# Patient Record
Sex: Female | Born: 1985 | Race: Black or African American | Hispanic: No | Marital: Single | Smoking: Current some day smoker
Health system: Southern US, Community
[De-identification: ages and names within clinical notes are randomized; demographics above are authoritative.]

## PROBLEM LIST (undated history)

## (undated) ENCOUNTER — Ambulatory Visit (HOSPITAL_COMMUNITY): Payer: Self-pay

## (undated) DIAGNOSIS — J45909 Unspecified asthma, uncomplicated: Secondary | ICD-10-CM

## (undated) DIAGNOSIS — R569 Unspecified convulsions: Secondary | ICD-10-CM

## (undated) HISTORY — PX: TUBAL LIGATION: SHX77

## (undated) HISTORY — DX: Unspecified convulsions: R56.9

## (undated) HISTORY — PX: CHOLECYSTECTOMY: SHX55

---
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
## 2007-03-19 ENCOUNTER — Inpatient Hospital Stay (HOSPITAL_COMMUNITY): Admission: AD | Admit: 2007-03-19 | Discharge: 2007-03-19 | Payer: Self-pay | Admitting: Obstetrics & Gynecology

## 2007-03-26 ENCOUNTER — Ambulatory Visit: Payer: Self-pay | Admitting: *Deleted

## 2007-03-26 ENCOUNTER — Inpatient Hospital Stay (HOSPITAL_COMMUNITY): Admission: AD | Admit: 2007-03-26 | Discharge: 2007-03-26 | Payer: Self-pay | Admitting: Obstetrics & Gynecology

## 2007-03-27 ENCOUNTER — Inpatient Hospital Stay (HOSPITAL_COMMUNITY): Admission: AD | Admit: 2007-03-27 | Discharge: 2007-03-27 | Payer: Self-pay | Admitting: Obstetrics

## 2007-03-27 ENCOUNTER — Ambulatory Visit: Payer: Self-pay | Admitting: Physician Assistant

## 2007-04-02 ENCOUNTER — Inpatient Hospital Stay (HOSPITAL_COMMUNITY): Admission: AD | Admit: 2007-04-02 | Discharge: 2007-04-03 | Payer: Self-pay | Admitting: Obstetrics & Gynecology

## 2007-04-02 ENCOUNTER — Ambulatory Visit: Payer: Self-pay | Admitting: Obstetrics & Gynecology

## 2007-04-06 ENCOUNTER — Ambulatory Visit: Payer: Self-pay | Admitting: Obstetrics & Gynecology

## 2007-04-06 ENCOUNTER — Inpatient Hospital Stay (HOSPITAL_COMMUNITY): Admission: AD | Admit: 2007-04-06 | Discharge: 2007-04-07 | Payer: Self-pay | Admitting: Obstetrics & Gynecology

## 2007-04-10 ENCOUNTER — Ambulatory Visit: Payer: Self-pay | Admitting: Obstetrics and Gynecology

## 2007-04-10 ENCOUNTER — Inpatient Hospital Stay (HOSPITAL_COMMUNITY): Admission: AD | Admit: 2007-04-10 | Discharge: 2007-04-10 | Payer: Self-pay | Admitting: Gynecology

## 2007-04-24 ENCOUNTER — Inpatient Hospital Stay (HOSPITAL_COMMUNITY): Admission: AD | Admit: 2007-04-24 | Discharge: 2007-04-24 | Payer: Self-pay | Admitting: Obstetrics and Gynecology

## 2007-04-24 ENCOUNTER — Ambulatory Visit: Payer: Self-pay | Admitting: Obstetrics and Gynecology

## 2007-04-30 ENCOUNTER — Ambulatory Visit (HOSPITAL_COMMUNITY): Admission: RE | Admit: 2007-04-30 | Discharge: 2007-04-30 | Payer: Self-pay | Admitting: Physician Assistant

## 2007-05-09 ENCOUNTER — Ambulatory Visit: Payer: Self-pay | Admitting: *Deleted

## 2007-05-11 ENCOUNTER — Ambulatory Visit: Payer: Self-pay | Admitting: *Deleted

## 2007-05-11 ENCOUNTER — Inpatient Hospital Stay (HOSPITAL_COMMUNITY): Admission: AD | Admit: 2007-05-11 | Discharge: 2007-05-13 | Payer: Self-pay | Admitting: Obstetrics & Gynecology

## 2007-05-15 ENCOUNTER — Inpatient Hospital Stay (HOSPITAL_COMMUNITY): Admission: AD | Admit: 2007-05-15 | Discharge: 2007-05-16 | Payer: Self-pay | Admitting: Obstetrics & Gynecology

## 2007-05-15 ENCOUNTER — Ambulatory Visit: Payer: Self-pay | Admitting: Obstetrics & Gynecology

## 2007-05-16 ENCOUNTER — Ambulatory Visit: Payer: Self-pay | Admitting: *Deleted

## 2007-05-20 ENCOUNTER — Ambulatory Visit: Payer: Self-pay | Admitting: *Deleted

## 2007-05-20 ENCOUNTER — Inpatient Hospital Stay (HOSPITAL_COMMUNITY): Admission: AD | Admit: 2007-05-20 | Discharge: 2007-05-20 | Payer: Self-pay

## 2007-05-21 ENCOUNTER — Ambulatory Visit (HOSPITAL_COMMUNITY): Admission: RE | Admit: 2007-05-21 | Discharge: 2007-05-21 | Payer: Self-pay | Admitting: Physician Assistant

## 2007-05-27 ENCOUNTER — Ambulatory Visit: Payer: Self-pay | Admitting: Obstetrics & Gynecology

## 2007-06-08 ENCOUNTER — Inpatient Hospital Stay (HOSPITAL_COMMUNITY): Admission: AD | Admit: 2007-06-08 | Discharge: 2007-06-08 | Payer: Self-pay | Admitting: Obstetrics & Gynecology

## 2007-06-10 ENCOUNTER — Ambulatory Visit: Payer: Self-pay | Admitting: Family Medicine

## 2007-06-10 ENCOUNTER — Ambulatory Visit (HOSPITAL_COMMUNITY): Admission: RE | Admit: 2007-06-10 | Discharge: 2007-06-10 | Payer: Self-pay | Admitting: Physician Assistant

## 2007-06-17 ENCOUNTER — Ambulatory Visit: Payer: Self-pay | Admitting: Family Medicine

## 2007-06-25 ENCOUNTER — Ambulatory Visit: Payer: Self-pay | Admitting: Gynecology

## 2007-06-25 ENCOUNTER — Inpatient Hospital Stay (HOSPITAL_COMMUNITY): Admission: AD | Admit: 2007-06-25 | Discharge: 2007-06-29 | Payer: Self-pay | Admitting: Obstetrics & Gynecology

## 2007-07-17 ENCOUNTER — Ambulatory Visit: Payer: Self-pay | Admitting: Obstetrics & Gynecology

## 2007-08-14 ENCOUNTER — Inpatient Hospital Stay (HOSPITAL_COMMUNITY): Admission: AD | Admit: 2007-08-14 | Discharge: 2007-08-14 | Payer: Self-pay | Admitting: Gynecology

## 2007-09-20 ENCOUNTER — Emergency Department (HOSPITAL_COMMUNITY): Admission: EM | Admit: 2007-09-20 | Discharge: 2007-09-20 | Payer: Self-pay | Admitting: Emergency Medicine

## 2007-09-27 ENCOUNTER — Emergency Department (HOSPITAL_COMMUNITY): Admission: EM | Admit: 2007-09-27 | Discharge: 2007-09-27 | Payer: Self-pay | Admitting: Emergency Medicine

## 2009-10-18 ENCOUNTER — Inpatient Hospital Stay (HOSPITAL_COMMUNITY): Admission: AD | Admit: 2009-10-18 | Discharge: 2009-10-18 | Payer: Self-pay | Admitting: Obstetrics & Gynecology

## 2010-02-14 ENCOUNTER — Inpatient Hospital Stay (HOSPITAL_COMMUNITY): Admission: AD | Admit: 2010-02-14 | Discharge: 2010-02-14 | Payer: Self-pay | Admitting: Obstetrics and Gynecology

## 2010-03-30 ENCOUNTER — Inpatient Hospital Stay (HOSPITAL_COMMUNITY): Admission: AD | Admit: 2010-03-30 | Discharge: 2010-03-30 | Payer: Self-pay | Admitting: Obstetrics and Gynecology

## 2010-04-02 ENCOUNTER — Inpatient Hospital Stay (HOSPITAL_COMMUNITY): Admission: AD | Admit: 2010-04-02 | Discharge: 2010-04-02 | Payer: Self-pay | Admitting: Obstetrics & Gynecology

## 2010-04-28 ENCOUNTER — Ambulatory Visit: Payer: Self-pay | Admitting: Family Medicine

## 2010-04-28 LAB — CONVERTED CEMR LAB
Basophils Relative: 0 % (ref 0–1)
Eosinophils Absolute: 0.1 10*3/uL (ref 0.0–0.7)
Glucose, Bld: 69 mg/dL — ABNORMAL LOW (ref 70–99)
HIV: NONREACTIVE
Hemoglobin: 9.8 g/dL — ABNORMAL LOW (ref 12.0–15.0)
Hgb A: 97.3 % (ref 96.8–97.8)
Hgb F Quant: 0 % (ref 0.0–2.0)
Lymphocytes Relative: 31 % (ref 12–46)
MCHC: 31.2 g/dL (ref 30.0–36.0)
Monocytes Relative: 8 % (ref 3–12)
Neutro Abs: 4.5 10*3/uL (ref 1.7–7.7)
RBC: 4.13 M/uL (ref 3.87–5.11)
RDW: 16.3 % — ABNORMAL HIGH (ref 11.5–15.5)
Rh Type: POSITIVE

## 2010-05-05 ENCOUNTER — Encounter: Payer: Self-pay | Admitting: Physician Assistant

## 2010-05-05 ENCOUNTER — Ambulatory Visit: Payer: Self-pay | Admitting: Obstetrics & Gynecology

## 2010-05-12 ENCOUNTER — Ambulatory Visit: Payer: Self-pay | Admitting: Obstetrics & Gynecology

## 2010-05-19 ENCOUNTER — Encounter: Payer: Self-pay | Admitting: Obstetrics and Gynecology

## 2010-05-19 ENCOUNTER — Ambulatory Visit: Payer: Self-pay | Admitting: Family Medicine

## 2010-05-26 ENCOUNTER — Ambulatory Visit: Payer: Self-pay | Admitting: Family Medicine

## 2010-05-26 ENCOUNTER — Inpatient Hospital Stay (HOSPITAL_COMMUNITY)
Admission: AD | Admit: 2010-05-26 | Discharge: 2010-05-29 | Payer: Self-pay | Source: Home / Self Care | Attending: Family Medicine | Admitting: Family Medicine

## 2010-05-27 ENCOUNTER — Ambulatory Visit (HOSPITAL_COMMUNITY): Admission: RE | Admit: 2010-05-27 | Payer: Self-pay | Source: Home / Self Care | Admitting: Obstetrics & Gynecology

## 2010-06-08 ENCOUNTER — Inpatient Hospital Stay (HOSPITAL_COMMUNITY)
Admission: AD | Admit: 2010-06-08 | Discharge: 2010-06-08 | Payer: Self-pay | Source: Home / Self Care | Attending: Obstetrics and Gynecology | Admitting: Obstetrics and Gynecology

## 2010-06-19 ENCOUNTER — Encounter: Payer: Self-pay | Admitting: Obstetrics

## 2010-07-07 ENCOUNTER — Ambulatory Visit: Payer: Self-pay | Admitting: Family Medicine

## 2010-08-08 LAB — POCT URINALYSIS DIPSTICK
Bilirubin Urine: NEGATIVE
Glucose, UA: NEGATIVE mg/dL
Glucose, UA: NEGATIVE mg/dL
Hgb urine dipstick: NEGATIVE
Ketones, ur: 40 mg/dL — AB
Nitrite: NEGATIVE
Nitrite: NEGATIVE
Nitrite: NEGATIVE
Protein, ur: NEGATIVE mg/dL
Protein, ur: NEGATIVE mg/dL
Protein, ur: NEGATIVE mg/dL
Specific Gravity, Urine: 1.02 (ref 1.005–1.030)
Specific Gravity, Urine: 1.015 (ref 1.005–1.030)
Specific Gravity, Urine: 1.015 (ref 1.005–1.030)
Specific Gravity, Urine: 1.02 (ref 1.005–1.030)
Urobilinogen, UA: 1 mg/dL (ref 0.0–1.0)
Urobilinogen, UA: 2 mg/dL — ABNORMAL HIGH (ref 0.0–1.0)
Urobilinogen, UA: 2 mg/dL — ABNORMAL HIGH (ref 0.0–1.0)
pH: 6 (ref 5.0–8.0)
pH: 6.5 (ref 5.0–8.0)

## 2010-08-08 LAB — CBC
HCT: 30.1 % — ABNORMAL LOW (ref 36.0–46.0)
Hemoglobin: 8.3 g/dL — ABNORMAL LOW (ref 12.0–15.0)
Hemoglobin: 9.2 g/dL — ABNORMAL LOW (ref 12.0–15.0)
MCHC: 30.9 g/dL (ref 30.0–36.0)
Platelets: 320 10*3/uL (ref 150–400)
RBC: 3.97 MIL/uL (ref 3.87–5.11)
RDW: 17 % — ABNORMAL HIGH (ref 11.5–15.5)
WBC: 8.8 10*3/uL (ref 4.0–10.5)

## 2010-08-09 LAB — URINE CULTURE
Colony Count: 30000
Culture  Setup Time: 201111030142

## 2010-08-09 LAB — URINALYSIS, ROUTINE W REFLEX MICROSCOPIC
Bilirubin Urine: NEGATIVE
Hgb urine dipstick: NEGATIVE
Nitrite: NEGATIVE
Urobilinogen, UA: 4 mg/dL — ABNORMAL HIGH (ref 0.0–1.0)
pH: 7 (ref 5.0–8.0)

## 2010-08-09 LAB — URINE MICROSCOPIC-ADD ON

## 2010-08-09 LAB — STREP B DNA PROBE: Strep Group B Ag: NEGATIVE

## 2010-08-09 LAB — GC/CHLAMYDIA PROBE AMP, GENITAL: Chlamydia, DNA Probe: NEGATIVE

## 2010-08-11 LAB — URINE CULTURE
Colony Count: NO GROWTH
Culture  Setup Time: 201109200226

## 2010-08-11 LAB — URINALYSIS, ROUTINE W REFLEX MICROSCOPIC
Nitrite: NEGATIVE
Specific Gravity, Urine: 1.01 (ref 1.005–1.030)
pH: 6.5 (ref 5.0–8.0)

## 2010-08-11 LAB — WET PREP, GENITAL

## 2010-08-11 LAB — URINE MICROSCOPIC-ADD ON

## 2010-08-11 LAB — GC/CHLAMYDIA PROBE AMP, GENITAL: GC Probe Amp, Genital: NEGATIVE

## 2010-08-15 LAB — URINE CULTURE: Colony Count: >=100000

## 2010-08-15 LAB — URINALYSIS, ROUTINE W REFLEX MICROSCOPIC
Bilirubin Urine: NEGATIVE
Ketones, ur: NEGATIVE mg/dL
Nitrite: POSITIVE — AB
pH: 7.5 (ref 5.0–8.0)

## 2010-08-15 LAB — WET PREP, GENITAL: Yeast Wet Prep HPF POC: NONE SEEN

## 2010-08-15 LAB — URINE MICROSCOPIC-ADD ON

## 2010-08-15 LAB — GC/CHLAMYDIA PROBE AMP, GENITAL: Chlamydia, DNA Probe: NEGATIVE
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
## 2010-10-11 NOTE — Discharge Summary (Signed)
NAMECLOMA, RAHRIG            ACCOUNT NO.:  [id number]   MEDICAL RECORD NO.:  [id number]          PATIENT TYPE:  INP   LOCATION:                                [facility]  [facility]   PHYSICIAN:  Tanya S. Shawnie Pons, M.D.   DATE OF BIRTH:  [birth year]   DATE OF ADMISSION:  DATE OF DISCHARGE:                               DISCHARGE SUMMARY   REASON FOR ADMISSION:  Onset of labor.   PROCEDURES:  Prenatal include ultrasound and NST.   HOSPITAL COURSE:  This is a 25 year old G2, P2-0-2-2, admitted at 40  weeks with spontaneous onset of labor.  She had late prenatal care  starting at 20 weeks.  Went to clinic at [facility].  She was  delivered via cesarean section a low transverse cervical due to  persistent nonreassuring fetal heart tones and failure to progress.  During the operation she was noted to have nuchal cord x1 which was  quite loose.  Her 3 vessel cord placenta was removed manually.  She  delivered a viable female, Apgar 1 at one minute and 9 at five minutes.  Weight 6 pounds 15 ounces, and length 19-1/2-inches.  EBL for the  procedure was 700 mL.  There were no complications postpartum.  For  complete summary of operative report, please see dictated note.   DISCHARGE DIAGNOSIS:  Term pregnancy, delivered.   INSTRUCTIONS:  She is to do no heavy lifting and to use pelvic rest for  6 weeks.  Her diet is routine.   MEDICATIONS:  Include:  1. Percocet 5/225, 1-2 tablets p.o. q.4-6 h. p.r.n. pain.  2. Ibuprofen 600 mg 1 tab p.o. q.6 h. p.r.n. pain.  3. Prenatal vitamins 1 tab p.o. daily.  4. Colace 100 mg 1 tab p.o. b.i.d. p.r.n. constipation.  5. Iron sulfate 325 mg 1 tab p.o. b.i.d.   Her status is well and she is to follow up in 6 weeks at [facility].   PERTINENT LABORATORY VALUES:  Include blood type A positive, antibody  negative, rubella immune.  Postoperative hemoglobin 6.7 on June 29, 2007, which was stable.   She is breast feeding and  bottle feeding in combination.  She is getting  a Depo shot for birth control, and she plans to get her circumcision  done at [facility].      Ancil Boozer, MD      Shelbie Proctor. Shawnie Pons, M.D.  Electronically Signed    SA/MEDQ  D:  06/29/2007  T:  06/29/2007  Job:  [id number]

## 2010-10-11 NOTE — Op Note (Signed)
[name], [name] NO.:  [id number]   MEDICAL RECORD NO.:  [id number]           PATIENT TYPE:   LOCATION:                                 FACILITY:   PHYSICIAN:  Ginger Carne, MD  DATE OF BIRTH:  [birth year]   DATE OF PROCEDURE:  DATE OF DISCHARGE:                               OPERATIVE REPORT   PREOPERATIVE DIAGNOSIS:  Failure to progress first stage of labor,  nonreassuring fetal heart rate.   POSTOPERATIVE DIAGNOSIS:  Failure to progress first stage of labor,  nonreassuring fetal heart rate, term viable delivery of female infant.   PROCEDURE:  Primary low transverse cesarean section.   SURGEON:  Ginger Carne, MD.   ASSISTANT:  None.   COMPLICATIONS:  None immediate.   ESTIMATED BLOOD LOSS:  700 mL.   SPECIMEN:  Cord bloods.   ANESTHESIA:  Epidural.   OPERATIVE FINDINGS:  Term viable female infant delivered in a vertex  presentation.  Apgars and weight per delivery room record.  No gross  abnormalities.  Baby cried spontaneously at delivery.  Amniotic fluid  was clear, not foul smelling.  Nuchal cord times one.  Placenta was  complete, three-vessel cord with central insertion.   OPERATIVE PROCEDURE:  The patient prepped and draped in usual fashion  and placed in left lateral supine position.  Betadine solution used for  antiseptic and the patient was catheterized prior to the procedure.  After adequate epidural analgesia, a Pfannenstiel incision was made and  the abdomen opened.  Lower uterine segment incised transversely after  developing the bladder flap.  Baby delivered, cord clamped and cut and  infant given to the pediatric staff after bulb suctioning.  Placenta  removed manually.  Uterus inspected.  Closure of the uterine musculature  in one layer with 0 Vicryl running interlocking suture.  Bleeding points  hemostatically checked.  Blood clots removed.  Closure of the fascia in  one layer with 0 Vicryl running suture, and skin  staples for the skin.  Instrument and sponge counts were correct.  The patient tolerated the  procedure well, returned post anesthesia recovery room in excellent  condition.     Ginger Carne, MD  Electronically Signed    SHB/MEDQ  D:  06/26/2007  T:  06/26/2007  Job:  [id number]

## 2010-10-14 NOTE — Discharge Summary (Signed)
Caitlin [name], Caitlin [name]               ACCOUNT NO.:  [id number]   MEDICAL RECORD NO.:  [id number]          PATIENT TYPE:  INP   LOCATION:  9159                          [facility]ILITY[facility]  [facility]   PHYSICIAN:  Roseanna Rainbow, M.D.DATE OF BIRTH:  03-06-1986   DATE OF ADMISSION:  02/07/2005  DATE OF DISCHARGE:  02/10/2005                                 DISCHARGE SUMMARY   CHIEF COMPLAINT:  The patient is a 25 year old gravida 1, para 0, who  presents at 36-6/7 weeks complaining of abdominal pain.   HISTORY OF PRESENT ILLNESS:  She reported worsening epigastric pain.  Prenatal course: source of care [facility]; Complications or  risks: adolescent, cholelithiasis.   ALLERGIES:  No known drug allergies.   MEDICATIONS:  Albuterol, Zyrtec, Singulair, Zofran, hydrocodone, Pepcid,  prenatal vitamins, and Benadryl.   PAST OB GYN HISTORY:  Noncontributory.   PAST MEDICAL HISTORY:  Please see the above.   PAST SURGICAL HISTORY:  She denies.   FAMILY AND SOCIAL HISTORY:  She is single, high [employer].   PRENATAL LAB RESULTS:  A positive, antibody screen negative, rubella immune,  hepatitis B surface antigen negative, syphilis nonreactive, HIV nonreactive,  one hour GTT.   PHYSICAL EXAMINATION:  VITAL SIGNS:  Stable, afebrile.  GENERAL:  Appears uncomfortable, mild distress.  ABDOMEN:  Gravid, right upper quadrant mildly tender.  EXTREMITIES:  No edema.  NEUROLOGIC:  Nonfocal.  Fetal heart rate baseline 130s-140s average.  Long term variability,  reactive, no deceleration, contractions none.   ASSESSMENT:  Intrauterine pregnancy at 36-6/7 weeks with acute gallstone  attack.  Fetal heart tracing consistent with fetal well being.   PLAN:  Admission, supportive measures.   HOSPITAL COURSE:  The patient was admitted and given analgesics as needed.  Her pain resolved.  She was felt not to have acute cholecystitis and she was  discharged to home on hospital day #3.   DISCHARGE DIAGNOSES:  1.  Intrauterine pregnancy at 36+ weeks.  2.  Cholelithiasis.   CONDITION:  Stable.   DIET:  Low fat.   MEDICATIONS:  Resume home medications.   DISPOSITION:  The patient was to followup in the office in 1 week.   ACTIVITY:  Ad lib      Roseanna Rainbow, M.D.  Electronically Signed     LAJ/MEDQ  D:  03/26/2005  T:  03/27/2005  Job:  [id number]

## 2010-10-14 NOTE — Discharge Summary (Signed)
[name], Caitlin [name]               ACCOUNT NO.:  [id number]   MEDICAL RECORD NO.:  [id number]          PATIENT TYPE:  INP   LOCATION:  9319                          [facility]   PHYSICIAN:  Roseanna Rainbow, M.D.DATE OF BIRTH:  12-08-1985   DATE OF ADMISSION:  10/06/2004  DATE OF DISCHARGE:  10/08/2004                                 DISCHARGE SUMMARY   CHIEF COMPLAINT:  The patient is an 25 year old gravida 1, para 0 with an  intrauterine pregnancy at 19+ weeks complaining of nausea and vomiting.  Please see the dictated history and physical for further details.   HOSPITAL COURSE:  The patient was admitted.  She was given anti-emetics and  hydration.  Her symptoms improved and she was discharged to home on hospital  day #2.   DISCHARGE DIAGNOSES:  1.  Hyperemesis gravidarum.  2.  Intrauterine pregnancy at 19+ weeks.   CONDITION ON DISCHARGE:  Stable.   DIET:  Regular.   ACTIVITY:  Ad lib.   MEDICATIONS:  Phenergan rectal suppositories as needed.   DISPOSITION:  The patient had a previous appointment in the office.       LAJ/MEDQ  D:  11/04/2004  T:  11/04/2004  Job:  [id number]

## 2010-10-14 NOTE — H&P (Signed)
Caitlin [name], Caitlin [name]               ACCOUNT NO.:  [id number]   MEDICAL RECORD NO.:  [id number]          PATIENT TYPE:  INP   LOCATION:  9319                          [facility]   PHYSICIAN:  Roseanna Rainbow, M.D.DATE OF BIRTH:  July 18, 1985   DATE OF ADMISSION:  10/06/2004  DATE OF DISCHARGE:                                HISTORY & PHYSICAL   CHIEF COMPLAINT:  The patient is an 25 year old, gravida 1, para 0 with an  intrauterine pregnancy at 19 plus weeks complaining of nausea and vomiting.   HISTORY OF PRESENT ILLNESS:  The patient has a history of pregnancy related  nausea and vomiting earlier during the pregnancy. However, over the last  four weeks, the vomiting had resolved and she has had intermittent nausea.  Over the past two days, she has had persistent nausea, vomiting and left  lower quadrant pain. She denies any fever, diarrhea, or exposures.  Antepartum course problems with adolescent see the above.   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 She has no  significant smoking history. She does not give any significant history of  alcohol usage. She denies illicit drug use.   MEDICATIONS:  Prenatal vitamins. Albuterol inhaler.   PAST MEDICAL HISTORY:  Asthma.   ALLERGIES:  No known drug allergies.   PHYSICAL EXAMINATION:  VITAL SIGNS:  Stable, afebrile.  GENERAL:  Slightly toxic appearing.  ABDOMEN:  Gravid, nontender, fundal height at the umbilicus.  PELVIC:  Sterile vaginal exam, the cervix is long and closed.   Urinalysis remarkable for large ketones, trace protein, no hematuria. Fetal  heart rate by Doppler 160.   ASSESSMENT:  Intrauterine pregnancy at 19 plus weeks with hyperemesis  gravidarum, dehydration also with lower abdominal pain, questionable  etiology.   PLAN:  Admission IV hydration, antiemetics.      LAJ/MEDQ  D:  10/06/2004  T:  10/06/2004  Job:  [id number]

## 2010-10-14 NOTE — Op Note (Signed)
[name], Caitlin               ACCOUNT NO.:  [id number]   MEDICAL RECORD NO.:  [id number]          PATIENT TYPE:  OIB   LOCATION:  0098                         FACILITY:  [facility]   PHYSICIAN:  Anselm Pancoast. Weatherly, M.D.DATE OF BIRTH:  06/22/1985   DATE OF PROCEDURE:  04/27/2005  DATE OF DISCHARGE:                                 OPERATIVE REPORT   PREOPERATIVE DIAGNOSIS:  Chronic cholecystitis, recent postpartum.   POSTOPERATIVE DIAGNOSIS:  Chronic cholecystitis, recent postpartum.   OPERATIONS:  Laparoscopic cholecystectomy with cholangiogram.   HISTORY:  Caitlin [name] is a 25 year old female who I saw in the office  about a 2 weeks ago. She looks younger than her stated age and she is about  8 weeks postpartum. She is being home schooled and has had a few episodes of  right upper quadrant abdominal pain. They noted gallstones on an ultrasound  during her pregnancy and she actually had not been to the emergency room at  any time. Dr. Clearance Coots is her regular physician and referred her to [place] with  nausea and vomiting and on physical exam she was not acutely tender. She is  very small and appears younger than her stated age of 70. I recommended that  we proceed on with a laparoscopic cholecystectomy and cholangiogram and she  is here today for the planned procedure.   Her liver function studies were normal preoperatively and her white count  was 3800. The patient was given 3 grams of Unasyn, she has PAS stockings and  taken to the operative suite. The abdomen was prepped with Betadine surgical  solution and draped in a sterile manner. The patient's got a lot of stretch  marks but no evidence of any masses or tenderness on exam. A small vertical  incision made below the umbilicus, the fascia identified, a small opening  carefully made through the fascia and then I picked up this with Kocher's.  The peritoneum was kind of opened carefully with a Tresa Endo and then a  pursestring suture of  #0 Vicryl was placed and the Hasson cannula  introduced. The gallbladder was not acutely inflamed, there were adhesions  up around it. The adhesions were carefully taken down, good hemostasis and  the peritoneum stripped away from the proximal portion of the gallbladder  and then the cystic duct and the cystic artery were both separated so that  you could see the area nicely. I then clipped the cystic duct junction with  the gallbladder and then doubly clipped the cystic artery proximally,  singly, distally and divided the artery. I then made a little opening within  the proximal cystic duct and a Cook catheter was introduced, held in place  with __________ and a good x-ray obtained which shows the intra and  extrahepatic biliary system visualizing good flow of dye into the duodenum.  The catheter was removed, the cystic duct stump was triply clipped and then  divided and then the gallbladder was freed from its bed using the hook  electrocautery and good hemostasis obtained. I then grasped the gallbladder  switching the camera to the upper 10  mm port and withdrew the gallbladder.  You could feel the small stones within it and it was sent intact to the  pathologist. Reinspection, good hemostasis, I tied the pursestring suture at  the umbilicus and put an additional figure-of-eight. We made a small  substernal area for the 10 mm trocar that had been anesthetized and two  lateral 5 mm trocars had been anesthetized. The umbilicus with fascia was  anesthetized and a figure-of-eight suture placed. I did place an anterior  fascia suture in the epigastric area and then the subcutaneous wounds were  closed with 4-0  Vicryl, Benzoin and Steri-Strips on skin. The patient tolerated the  procedure nicely and she hopes to be released this afternoon. She can resume  the care for her child and I think is going to return to regular school at  [employer] some time in the next few weeks or at least after  Christmas.           ______________________________  Anselm Pancoast. Zachery Dakins, M.D.     WJW/MEDQ  D:  04/27/2005  T:  04/28/2005  Job:  [id number]   cc:   Leonette Most A. Clearance Coots, M.D.  Fax: [phone]

## 2010-10-14 NOTE — H&P (Signed)
Caitlin [name], Caitlin [name]               ACCOUNT NO.:  [id number]   MEDICAL RECORD NO.:  [id number]          PATIENT TYPE:  INP   LOCATION:  9168                          [facility]   PHYSICIAN:  Roseanna Rainbow, M.D.DATE OF BIRTH:  06-Jun-1985   DATE OF ADMISSION:  01/17/2005  DATE OF DISCHARGE:                                HISTORY & PHYSICAL   CHIEF COMPLAINT:  The patient is a 25 year old gravida 1 para 0 with an  estimated date of confinement of March 01, 2005 with an intrauterine  pregnancy at 33+ weeks complaining of abdominal pain and uterine  contractions.   HISTORY OF PRESENT ILLNESS:  The patient had similar complaints and was  recently hospitalized in early August. Workup at that point included an  abdominal ultrasound on January 03, 2005 that demonstrated gallstones and  gallbladder sludge. General surgery was consulted and the decision was made  to proceed with expectant management until after the patient delivers, at  which point she would undergo a cholecystectomy.   ANTEPARTUM PROBLEMS/RISKS:  Please see the above. She has history of asthma.  She is an adolescent.   PRENATAL SCREENS:  Antibody screen negative. Blood type A positive.  Chlamydia negative. GC negative. Hepatitis B surface antigen negative.  Hemoglobin 10.9. HIV nonreactive. Pap smear within normal limits. Platelets  392,000. Sickle cell screen negative. Urine culture and sensitivity  negative. RPR nonreactive. An ultrasound on April 10 at 14 weeks 5 days  demonstrated a limited anatomic survey.   SOCIAL HISTORY:  She is a Advice worker at [employer]. She does not have any  significant smoking history. She does not give any significant history of  alcohol usage. She denies illicit drug use.   PAST OBSTETRICAL AND GYNECOLOGICAL HISTORY:  Noncontributory.   PAST MEDICAL HISTORY:  Please see the above.   PAST SURGICAL HISTORY:  She denies.   ALLERGIES:  No known drug allergies.   PHYSICAL  EXAMINATION:  VITAL SIGNS:  Stable, afebrile. Fetal heart rate with  the Doppler 130s.  ABDOMEN:  There is right epigastric tenderness. However, there is no rebound  or guarding.  PELVIC:  Sterile vaginal exam:  The cervix is closed and posterior. There is  lower uterine segment development.   ASSESSMENT:  Intrauterine pregnancy at 33+ weeks, rule out biliary colic.   PLAN:  Admission, laboratory work, supportive care, reconsult with general  surgery as needed.      Roseanna Rainbow, M.D.  Electronically Signed     LAJ/MEDQ  D:  01/18/2005  T:  01/18/2005  Job:  [id number]

## 2010-10-14 NOTE — Discharge Summary (Signed)
NAMEJARITA, RAVAL               ACCOUNT NO.:  [id number]   MEDICAL RECORD NO.:  [id number]          PATIENT TYPE:  INP   LOCATION:  9155                          FACILITY:  [facility]   PHYSICIAN:  Charles A. Clearance Coots, M.D.DATE OF BIRTH:  21-Mar-1986   DATE OF ADMISSION:  01/01/2005  DATE OF DISCHARGE:  01/05/2005                                 DISCHARGE SUMMARY   ADMITTING DIAGNOSES:  1.  [redacted] weeks gestation.  2.  Severe abdominal pain.  3.  Rule out cholecystitis.   DISCHARGE DIAGNOSES:  1.  [redacted] weeks gestation.  2.  Severe abdominal pain.  3.  Cholecystitis and gallstones on ultrasound and clinically, much improved      after intravenous hydration and dietary management and supportive care.      Discharged home at approximately [redacted] weeks gestation undelivered,      improved.   REASON FOR ADMISSION:  A 25 year old black female.  Estimated date of  confinement of March 01, 2005.  Presented to triage at [facility]  with severe upper abdominal pain which had started several hours earlier to  her presentation.  The patient also complained of a pinkish discharge.  She  denied nausea, vomiting, diarrhea, or dysuria.   PAST MEDICAL HISTORY:   SURGERY:  None.   ILLNESSES:  Seasonal allergies.   MEDICATIONS:  Albuterol inhaler, Singulair, Zyrtec.   ALLERGIES:  No known drug allergies.   SOCIAL HISTORY:  Adolescent.  Negative tobacco, alcohol, or recreational  drug use.   PHYSICAL EXAMINATION:  GENERAL:  Small, slim adolescent in moderate  distress.  VITAL SIGNS:  Temperature 98.6, pulse 76, respiratory rate 22, blood  pressure 111/57.  HEENT:  Normal.  LUNGS:  Clear to auscultation bilaterally.  HEART:  Regular rate and rhythm.  ABDOMEN:  Gravid.  Severe right upper quadrant tenderness to palpation.  Positive Murphy's.  Uterus is nontender.  PELVIC:  The cervix is long and closed.   LABORATORY VALUES:  Hemoglobin 9.4, hematocrit 28.5, white blood cell count  6100,  platelets 349,000.  Comprehensive metabolic panel was within normal  limits.  Fetal fibronectin was negative.  Wet prep revealed too numerous to  count white blood cells.  Urinalysis within normal limits.   HOSPITAL COURSE:  The patient was admitted and started on IV fluid  hydration, IV antibiotic therapy, and analgesic therapy.  She responded well  to therapy and gallbladder ultrasound was performed the following morning  after admission which revealed gallstones and gallbladder sludge which was  consistent with acute cholecystitis and gallstones.  General surgery  consultation was obtained by Dr. Consuello Bossier and evaluation was done  and recommendation was made for an attempt at conservative management during  pregnancy and possible operative surgical therapy after delivery of the baby  if possible.  Patient continued to improve clinically and able to tolerate a  diet and was therefore discharged home on hospital day #4 at approximately  [redacted] weeks gestation undelivered and much improved.   DISCHARGE LABORATORY VALUES:  Hemoglobin 8.5, hematocrit 25.5, white blood  cell count 5100, platelets 302,000.  Comprehensive metabolic panel  was  within normal limits except for a potassium of 3.2.   DISCHARGE DISPOSITION:  Patient is to continue with a low fat type diet at  home and will follow up in the office in one week.  Tylenol elixir was  prescribed for pain and she was to continue with Niferex elixir for her  anemia.  Patient apparently cannot take pills very well.      Charles A. Clearance Coots, M.D.  Electronically Signed     CAH/MEDQ  D:  01/05/2005  T:  01/05/2005  Job:  [id number]   cc:   Anselm Pancoast. Zachery Dakins, M.D.  [address], [address]  [place]  [place] [address]

## 2010-10-14 NOTE — Consult Note (Signed)
[name], Caitlin               ACCOUNT NO.:  [id number]   MEDICAL RECORD NO.:  [id number]          PATIENT TYPE:  INP   LOCATION:  9155                          [facility]ACILITY:  [facility]   PHYSICIAN:  Anselm Pancoast. Weatherly, M.D.DATE OF BIRTH:  1986/04/24   DATE OF CONSULTATION:  01/02/2005  DATE OF DISCHARGE:                                   CONSULTATION   CHIEF COMPLAINT:  Gallstones, intrauterine pregnancy.   HISTORY:  Caitlin [name] is a 25 year old female who was admitted by Dr.  Clearance Coots with symptomatic gallstones on Saturday. The patient is due March 01, 2005 and she states that she is still having some epigastric pain.  However, fortunately her white count has been normal and her liver function  studies are normal with the exception of a slightly elevated alkaline  phosphatase of 133. An ultrasound this morning did show sludge within her  gallbladder and possible tenderness with pressure with the probe, cannot  exclude acute cholecystitis, and there is mild right hydronephrosis because  of her pregnancy.   On physical exam, at this time the patient is sleeping. She had been given  sedation and medication shortly earlier. Her father is with her and on  examination of her abdomen she is certainly not acutely tender but it is  possible that she is mildly tender on palpation of the right upper quadrant.  She is presently on a low fat diet and solid food, but she has not eaten any  lunch, and hopefully her symptoms can be controlled with mild pain  medication and then a low fat diet and not have to proceed with a  cholecystectomy until after she delivers. If she does continue to have more  frequent episodes of pain it is possible that she will not be able to wait  the nearly 2 months and other alternatives can be performed. At this time I  would tend to try to treat her symptomatically and not make any plans for  surgery but hopefully be able to complete the pregnancy. We will follow  along since she is obviously not ready for discharge now.       WJW/MEDQ  D:  01/02/2005  T:  01/02/2005  Job:  [id number]

## 2010-10-14 NOTE — H&P (Signed)
NAMEVERLINDA, [name]               ACCOUNT NO.:  [id number]   MEDICAL RECORD NO.:  [id number]          PATIENT TYPE:  INP   LOCATION:  9175                          [facility]ACILITY:  [facility]   PHYSICIAN:  Roseanna Rainbow, M.D.DATE OF BIRTH:  [birth year]   DATE OF ADMISSION:  02/20/2005  DATE OF DISCHARGE:                                HISTORY & PHYSICAL   CHIEF COMPLAINT:  The patient is a 25 year old gravida 1, para 0 with an  estimated date of confinement of March 01, 2005 with an intrauterine  pregnancy at 38+ weeks who presents for induction of labor.   HISTORY OF PRESENT ILLNESS:  Please see the above.  Antepartum course,  problems, risks:  Cholelithiasis, cholestasis, asthma, adolescent.   LABORATORY DATA:  Prenatal screens with antibody screen negative, blood type  A positive. Gonorrhea and Chlamydia negative.  GBS negative on January 28, 2005.  Hepatitis B surface antigen negative.  Hemoglobin 10.9, HIV negative.  Pap smear within normal limits.  Platelet count 392,000. Rubella immune.  Sickle cell negative.  Urine culture and sensitivity negative.  RPR  nonreactive.  Ultrasound on September 05, 2004 estimated gestational age [redacted]  weeks, 5 days which was one week different from her estimated gestational  age by dates.   SOCIAL HISTORY:  She is a Advice worker at [employer].  She has no significant  smoking history.  Does not give any significant history of alcohol usage.  Denies illicit drug use.   PAST MEDICAL HISTORY:  Please see the above.   PAST SURGICAL HISTORY:  She denies past surgery.   PAST OBSTETRICAL/GYNECOLOGICAL HISTORY:  Noncontributory.   ALLERGIES:  No known drug allergies.   MEDICATIONS:  1.  Singulair.  2.  Zyrtec.  3.  Actigall.  4.  Prenatal vitamins.  5.  Phenergan.  6.  Tylenol.   PHYSICAL EXAMINATION:  VITAL SIGNS:  Stable, afebrile.  Fetal heart tracing  reassuring.  Tocodynamometer with no uterine contractions.  PELVIC:  Sterile vaginal examination  in the office shows cervix mid, closed,  50% effaced.   ASSESSMENT:  Primigravida with intrauterine pregnancy at term complicated by  cholelithiasis, cholestasis, unfavorable Bishop's score.   PLAN:  Admission, two stage induction of labor.      Roseanna Rainbow, M.D.  Electronically Signed     LAJ/MEDQ  D:  02/20/2005  T:  02/20/2005  Job:  [id number]

## 2011-02-15 LAB — POCT URINALYSIS DIP (DEVICE)
Operator id: 297281
Protein, ur: 30 — AB
Urobilinogen, UA: 1

## 2011-02-16 LAB — POCT URINALYSIS DIP (DEVICE)
Operator id: 297281
Protein, ur: NEGATIVE
Urobilinogen, UA: 0.2

## 2011-02-16 LAB — CBC
HCT: 20.7 — ABNORMAL LOW
Hemoglobin: 6.1 — CL
Hemoglobin: 6.6 — CL
MCHC: 32.4
MCHC: 33.4
MCV: 71 — ABNORMAL LOW
MCV: 72.3 — ABNORMAL LOW
Platelets: 399
RBC: 2.56 — ABNORMAL LOW
RBC: 2.77 — ABNORMAL LOW
RBC: 2.86 — ABNORMAL LOW
RDW: 19.7 — ABNORMAL HIGH
WBC: 8.8

## 2011-02-16 LAB — RPR: RPR Ser Ql: NONREACTIVE

## 2011-03-03 LAB — URINALYSIS, ROUTINE W REFLEX MICROSCOPIC
Bilirubin Urine: NEGATIVE
Bilirubin Urine: NEGATIVE
Glucose, UA: NEGATIVE
Glucose, UA: NEGATIVE
Hgb urine dipstick: NEGATIVE
Ketones, ur: NEGATIVE
Protein, ur: NEGATIVE
Specific Gravity, Urine: 1.02
Urobilinogen, UA: 1
pH: 6.5

## 2011-03-03 LAB — POCT URINALYSIS DIP (DEVICE)
Glucose, UA: 100 — AB
Hgb urine dipstick: NEGATIVE
Operator id: 14902
Specific Gravity, Urine: 1.02

## 2011-03-06 LAB — HEPATITIS B SURFACE ANTIGEN: Hepatitis B Surface Ag: NEGATIVE

## 2011-03-06 LAB — CBC
MCHC: 32.8
MCV: 76.3 — ABNORMAL LOW
Platelets: 271
RBC: 3.56 — ABNORMAL LOW
WBC: 9.8

## 2011-03-06 LAB — STREP B DNA PROBE: Strep Group B Ag: NEGATIVE

## 2011-03-06 LAB — MAGNESIUM: Magnesium: 5.6 — ABNORMAL HIGH

## 2011-03-06 LAB — POCT URINALYSIS DIP (DEVICE)
Bilirubin Urine: NEGATIVE
Glucose, UA: NEGATIVE
Hgb urine dipstick: NEGATIVE
Operator id: 134861
Specific Gravity, Urine: 1.025

## 2011-03-06 LAB — DIFFERENTIAL
Band Neutrophils: 0
Basophils Relative: 0
Eosinophils Relative: 0
Lymphocytes Relative: 19
Promyelocytes Absolute: 0
Smear Review: ADEQUATE

## 2011-03-06 LAB — RPR: RPR Ser Ql: NONREACTIVE

## 2011-03-07 LAB — URINALYSIS, ROUTINE W REFLEX MICROSCOPIC
Bilirubin Urine: NEGATIVE
Bilirubin Urine: NEGATIVE
Glucose, UA: NEGATIVE
Glucose, UA: NEGATIVE
Glucose, UA: NEGATIVE
Hgb urine dipstick: NEGATIVE
Ketones, ur: 15 — AB
Ketones, ur: NEGATIVE
Ketones, ur: NEGATIVE
Nitrite: NEGATIVE
Nitrite: NEGATIVE
Nitrite: NEGATIVE
Protein, ur: NEGATIVE
Protein, ur: NEGATIVE
Specific Gravity, Urine: 1.025
Specific Gravity, Urine: 1.01
Urobilinogen, UA: 0.2
pH: 6.5
pH: 6

## 2011-03-07 LAB — URINE MICROSCOPIC-ADD ON

## 2011-03-07 LAB — STREP B DNA PROBE

## 2011-03-07 LAB — WET PREP, GENITAL
Clue Cells Wet Prep HPF POC: NONE SEEN
Trich, Wet Prep: NONE SEEN

## 2011-03-07 LAB — FETAL FIBRONECTIN: Fetal Fibronectin: NEGATIVE

## 2011-03-08 LAB — URINALYSIS, ROUTINE W REFLEX MICROSCOPIC
Bilirubin Urine: NEGATIVE
Hgb urine dipstick: NEGATIVE
Nitrite: NEGATIVE
Nitrite: POSITIVE — AB
Protein, ur: NEGATIVE
Specific Gravity, Urine: >1.03 — ABNORMAL HIGH
Urobilinogen, UA: 1
Urobilinogen, UA: 4 — ABNORMAL HIGH
pH: 6

## 2011-03-08 LAB — GC/CHLAMYDIA PROBE AMP, GENITAL
Chlamydia, DNA Probe: POSITIVE — AB
GC Probe Amp, Genital: POSITIVE — AB

## 2011-03-08 LAB — URINE MICROSCOPIC-ADD ON

## 2011-03-08 LAB — FETAL FIBRONECTIN: Fetal Fibronectin: NEGATIVE

## 2011-03-08 LAB — WET PREP, GENITAL: Yeast Wet Prep HPF POC: NONE SEEN

## 2012-05-31 ENCOUNTER — Emergency Department (HOSPITAL_COMMUNITY)
Admission: EM | Admit: 2012-05-31 | Discharge: 2012-05-31 | Disposition: A | Discharge status: Home / Self Care | Payer: Self-pay | Attending: Emergency Medicine | Admitting: Emergency Medicine

## 2012-05-31 ENCOUNTER — Encounter (HOSPITAL_COMMUNITY): Payer: Self-pay | Admitting: Emergency Medicine

## 2012-05-31 DIAGNOSIS — F17200 Nicotine dependence, unspecified, uncomplicated: Secondary | ICD-10-CM | POA: Insufficient documentation

## 2012-05-31 DIAGNOSIS — J111 Influenza due to unidentified influenza virus with other respiratory manifestations: Secondary | ICD-10-CM | POA: Insufficient documentation

## 2012-05-31 DIAGNOSIS — R5381 Other malaise: Secondary | ICD-10-CM | POA: Insufficient documentation

## 2012-05-31 DIAGNOSIS — R062 Wheezing: Secondary | ICD-10-CM | POA: Insufficient documentation

## 2012-05-31 DIAGNOSIS — J029 Acute pharyngitis, unspecified: Secondary | ICD-10-CM | POA: Insufficient documentation

## 2012-05-31 DIAGNOSIS — J3489 Other specified disorders of nose and nasal sinuses: Secondary | ICD-10-CM | POA: Insufficient documentation

## 2012-05-31 DIAGNOSIS — R509 Fever, unspecified: Secondary | ICD-10-CM | POA: Insufficient documentation

## 2012-05-31 DIAGNOSIS — R630 Anorexia: Secondary | ICD-10-CM | POA: Insufficient documentation

## 2012-05-31 DIAGNOSIS — Z79899 Other long term (current) drug therapy: Secondary | ICD-10-CM | POA: Insufficient documentation

## 2012-05-31 DIAGNOSIS — R51 Headache: Secondary | ICD-10-CM | POA: Insufficient documentation

## 2012-05-31 DIAGNOSIS — R112 Nausea with vomiting, unspecified: Secondary | ICD-10-CM | POA: Insufficient documentation

## 2012-05-31 DIAGNOSIS — R000 Tachycardia, unspecified: Secondary | ICD-10-CM | POA: Insufficient documentation

## 2012-05-31 MED ORDER — PROMETHAZINE HCL 25 MG PO TABS: 25.0000 mg | ORAL_TABLET | Freq: Four times a day (QID) | ORAL | Status: DC | PRN

## 2012-05-31 MED ORDER — ACETAMINOPHEN 325 MG PO TABS
650.0000 mg | ORAL_TABLET | Freq: Once | ORAL | Status: AC
  Administered 2012-05-31: 650 mg via ORAL
  Filled 2012-05-31: qty 2

## 2012-05-31 MED ORDER — OSELTAMIVIR PHOSPHATE 75 MG PO CAPS: 75.0000 mg | ORAL_CAPSULE | Freq: Two times a day (BID) | ORAL | Status: DC

## 2012-05-31 MED ORDER — ONDANSETRON 4 MG PO TBDP
4.0000 mg | ORAL_TABLET | Freq: Once | ORAL | Status: AC
  Administered 2012-05-31: 4 mg via ORAL
  Filled 2012-05-31: qty 1

## 2012-05-31 MED ORDER — ALBUTEROL SULFATE HFA 108 (90 BASE) MCG/ACT IN AERS: 1.0000 | INHALATION_SPRAY | Freq: Four times a day (QID) | RESPIRATORY_TRACT | Status: DC | PRN

## 2012-05-31 MED ORDER — SODIUM CHLORIDE 0.9 % IV SOLN: Freq: Once | INTRAVENOUS | Status: DC

## 2012-05-31 NOTE — ED Notes (Signed)
Pt presenting to ed with c/o flu symptoms x 2-3 days. Pt states positive nausea, vomiting and no diarrhea. Pt states positive fever with chills.

## 2012-05-31 NOTE — ED Provider Notes (Signed)
History     CSN: [id number]  Arrival date & time 05/31/12  1644   First MD Initiated Contact with Patient 05/31/12 1833      Chief Complaint  Patient presents with  . flu like symptoms    (Consider location/radiation/quality/duration/timing/severity/associated sxs/prior treatment) HPI Comments: Pt presents to the ED with complaints of flu-like symptoms of dry cough, congestion, sore throat, muscle aches, chills, fever, ear pain, headaches, nausea, and vomiting. The patient states that the symptoms started two days ago.  Pt has been around other sick contacts and did not get the flu shot this year. The patient denies neck pain/stiffness, weakness, vision changes, severe abdominal pain, inability to eat or drink, difficulty breathing, SOB or chest pain. She does have a history of Asthma and reports that she has had increased wheezing today.  She has been using her Albuterol inhaler, which helps somewhat.  She has not taken anything else for her symptoms.  The history is provided by the patient.    History reviewed. No pertinent past medical history.  Past Surgical History  Procedure Date  . Cesarean section   . Cholecystectomy     No family history on file.  History  Substance Use Topics  . Smoking status: Current Every Day Smoker  . Smokeless tobacco: Not on file  . Alcohol Use: No    OB History    Grav Para Term Preterm Abortions TAB SAB Ect Mult Living                  Review of Systems  Constitutional: Positive for fever, chills, appetite change and fatigue.  HENT: Positive for congestion, sore throat and rhinorrhea. Negative for ear pain, drooling, trouble swallowing, neck pain, neck stiffness and voice change.   Respiratory: Positive for cough and wheezing. Negative for shortness of breath.   Cardiovascular: Negative for chest pain.  Gastrointestinal: Positive for nausea and vomiting. Negative for abdominal pain and diarrhea.  Skin: Negative for rash.    Neurological: Positive for headaches. Negative for dizziness, syncope and light-headedness.  Psychiatric/Behavioral: Negative for confusion.    Allergies  Review of patient's allergies indicates no known allergies.  Home Medications   Current Outpatient Rx  Name  Route  Sig  Dispense  Refill  . ALBUTEROL SULFATE HFA 108 (90 BASE) MCG/ACT IN AERS   Inhalation   Inhale 1 puff into the lungs every 4 (four) hours as needed. Shortness of breath         . GUAIFENESIN ER 600 MG PO TB12   Oral   Take 600 mg by mouth 2 (two) times daily.           BP 113/66  Pulse 96  Temp 100.2 F (37.9 C) (Oral)  Resp 16  SpO2 99%  LMP 05/24/2012  Physical Exam  Nursing note and vitals reviewed. Constitutional: She appears well-developed and well-nourished. No distress.  HENT:  Head: Normocephalic and atraumatic.  Right Ear: Tympanic membrane and ear canal normal.  Left Ear: Tympanic membrane and ear canal normal.  Nose: Rhinorrhea present.  Mouth/Throat: Uvula is midline, oropharynx is clear and moist and mucous membranes are normal.  Neck: Normal range of motion. Neck supple.  Cardiovascular: Normal heart sounds.  Tachycardia present.   Pulmonary/Chest: Effort normal and breath sounds normal. No respiratory distress. She has no wheezes. She has no rales.  Abdominal: Soft. She exhibits no distension and no mass. There is no tenderness. There is no rebound and no guarding.  Musculoskeletal: Normal  range of motion.  Neurological: She is alert.  Skin: Skin is warm and dry. No rash noted. She is not diaphoretic.  Psychiatric: She has a normal mood and affect.    ED Course  Procedures (including critical care time)  Labs Reviewed - No data to display No results found.   No diagnosis found.    MDM  Patient with symptoms consistent with influenza.  Vitals are stable.  Fever and tachycardia responded to Tylenol.  No signs of dehydration, tolerating PO's.  Lungs are clear. Due to  patient's presentation and physical exam a chest x-ray was not ordered bc likely diagnosis of flu.  Discussed the cost versus benefit of Tamiflu treatment with the patient.  The onset of patients was within the 48 hour window for Tamiflu.  Therefore, patient given Rx for Tamiflu.  Patient will be discharged with instructions to orally hydrate, rest, and use over-the-counter medications such as anti-inflammatories ibuprofen and Aleve for muscle aches and Tylenol for fever.           Pascal Lux [place], PA-C 06/01/12 [phone]

## 2012-06-01 NOTE — ED Provider Notes (Signed)
Medical screening examination/treatment/procedure(s) were performed by non-physician practitioner and as supervising physician I was immediately available for consultation/collaboration.  Ermon Sagan T Baraka Klatt, MD 06/01/12 1646 

## 2012-06-04 ENCOUNTER — Emergency Department (HOSPITAL_COMMUNITY)
Admission: EM | Admit: 2012-06-04 | Discharge: 2012-06-04 | Disposition: A | Discharge status: Home / Self Care | Payer: Self-pay | Attending: Emergency Medicine | Admitting: Emergency Medicine

## 2012-06-04 ENCOUNTER — Encounter (HOSPITAL_COMMUNITY): Payer: Self-pay | Admitting: *Deleted

## 2012-06-04 DIAGNOSIS — R05 Cough: Secondary | ICD-10-CM | POA: Insufficient documentation

## 2012-06-04 DIAGNOSIS — K529 Noninfective gastroenteritis and colitis, unspecified: Secondary | ICD-10-CM

## 2012-06-04 DIAGNOSIS — F17200 Nicotine dependence, unspecified, uncomplicated: Secondary | ICD-10-CM | POA: Insufficient documentation

## 2012-06-04 DIAGNOSIS — R197 Diarrhea, unspecified: Secondary | ICD-10-CM | POA: Insufficient documentation

## 2012-06-04 DIAGNOSIS — Z79899 Other long term (current) drug therapy: Secondary | ICD-10-CM | POA: Insufficient documentation

## 2012-06-04 DIAGNOSIS — K5289 Other specified noninfective gastroenteritis and colitis: Secondary | ICD-10-CM | POA: Insufficient documentation

## 2012-06-04 DIAGNOSIS — R059 Cough, unspecified: Secondary | ICD-10-CM | POA: Insufficient documentation

## 2012-06-04 DIAGNOSIS — R509 Fever, unspecified: Secondary | ICD-10-CM | POA: Insufficient documentation

## 2012-06-04 LAB — CBC WITH DIFFERENTIAL/PLATELET
Basophils Absolute: 0 10*3/uL (ref 0.0–0.1)
Eosinophils Absolute: 0 10*3/uL (ref 0.0–0.7)
Eosinophils Relative: 0 % (ref 0–5)
MCH: 24.2 pg — ABNORMAL LOW (ref 26.0–34.0)
Monocytes Absolute: 0.4 10*3/uL (ref 0.1–1.0)
Neutrophils Relative %: 68 % (ref 43–77)
Platelets: 433 10*3/uL — ABNORMAL HIGH (ref 150–400)
RBC: 4.58 MIL/uL (ref 3.87–5.11)
RDW: 14.9 % (ref 11.5–15.5)

## 2012-06-04 LAB — COMPREHENSIVE METABOLIC PANEL
ALT: 22 U/L (ref 0–35)
Alkaline Phosphatase: 69 U/L (ref 39–117)
BUN: 8 mg/dL (ref 6–23)
CO2: 22 mEq/L (ref 19–32)
GFR calc Af Amer: >90 mL/min (ref >90)
GFR calc non Af Amer: >90 mL/min (ref >90)
Glucose, Bld: 120 mg/dL — ABNORMAL HIGH (ref 70–99)
Total Bilirubin: 0.5 mg/dL (ref 0.3–1.2)

## 2012-06-04 MED ORDER — PROMETHAZINE HCL 25 MG/ML IJ SOLN
25.0000 mg | Freq: Once | INTRAMUSCULAR | Status: AC
  Administered 2012-06-04: 25 mg via INTRAVENOUS
  Filled 2012-06-04: qty 1

## 2012-06-04 MED ORDER — DIPHENOXYLATE-ATROPINE 2.5-0.025 MG PO TABS
2.0000 | ORAL_TABLET | Freq: Once | ORAL | Status: AC
  Administered 2012-06-04: 2 via ORAL
  Filled 2012-06-04: qty 2

## 2012-06-04 MED ORDER — ONDANSETRON 8 MG PO TBDP: 8.0000 mg | ORAL_TABLET | Freq: Three times a day (TID) | ORAL | Status: DC | PRN

## 2012-06-04 MED ORDER — SODIUM CHLORIDE 0.9 % IV BOLUS (SEPSIS)
2000.0000 mL | Freq: Once | INTRAVENOUS | Status: AC
  Administered 2012-06-04: 2000 mL via INTRAVENOUS

## 2012-06-04 MED ORDER — SODIUM CHLORIDE 0.9 % IV SOLN
Freq: Once | INTRAVENOUS | Status: AC
  Administered 2012-06-04: 03:00:00 via INTRAVENOUS

## 2012-06-04 MED ORDER — ONDANSETRON HCL 4 MG/2ML IJ SOLN
4.0000 mg | Freq: Once | INTRAMUSCULAR | Status: AC
  Administered 2012-06-04: 4 mg via INTRAVENOUS
  Filled 2012-06-04: qty 2

## 2012-06-04 MED ORDER — DIPHENOXYLATE-ATROPINE 2.5-0.025 MG PO TABS: 1.0000 | ORAL_TABLET | Freq: Four times a day (QID) | ORAL | Status: DC | PRN

## 2012-06-04 NOTE — ED Notes (Signed)
Pt diagnosed with flu yesterday; continues with vomiting/diarrhea/fever; c/o stomach burning

## 2012-06-04 NOTE — ED Notes (Signed)
Pt was given ginger ale-- taking sips, tolerating well; denies nausea at this time.

## 2012-06-04 NOTE — ED Notes (Signed)
Pt just took a few sips of her ginger ale--- denies nausea; encouraged to drink more to monitor her oral intake tolerance.

## 2012-06-04 NOTE — ED Provider Notes (Signed)
History     CSN: [id number]  Arrival date & time 06/04/12  0032   First MD Initiated Contact with Patient 06/04/12 0400      Chief Complaint  Patient presents with  . Nausea, Vomiting and Diarrhea     (Consider location/radiation/quality/duration/timing/severity/associated sxs/prior treatment) HPI This is a 27 year old female with a two-day history of flulike symptoms. Specifically she has had body aches, fever, cough, nausea, vomiting and diarrhea. She was seen 2 days ago and started on Tamiflu and Phenergan. She states she's not been able to keep these down. She continues to have nausea, vomiting and diarrhea. She states the fevers have improved. She feels lightheaded and thirsty. She describes her symptoms as moderate to severe. She was given IV Zofran prior to my evaluation without relief of her nausea.  History reviewed. No pertinent past medical history.  Past Surgical History  Procedure Date  . Cesarean section   . Cholecystectomy   . Tubal ligation     No family history on file.  History  Substance Use Topics  . Smoking status: Current Every Day Smoker  . Smokeless tobacco: Not on file  . Alcohol Use: No    OB History    Grav Para Term Preterm Abortions TAB SAB Ect Mult Living                  Review of Systems  All other systems reviewed and are negative.    Allergies  Review of patient's allergies indicates no known allergies.  Home Medications   Current Outpatient Rx  Name  Route  Sig  Dispense  Refill  . ALBUTEROL SULFATE HFA 108 (90 BASE) MCG/ACT IN AERS   Inhalation   Inhale 1 puff into the lungs every 4 (four) hours as needed. Shortness of breath         . GUAIFENESIN ER 600 MG PO TB12   Oral   Take 600 mg by mouth 2 (two) times daily.         . OSELTAMIVIR PHOSPHATE 75 MG PO CAPS   Oral   Take 1 capsule (75 mg total) by mouth every 12 (twelve) hours.   10 capsule   0   . PROMETHAZINE HCL 25 MG PO TABS   Oral   Take 1 tablet (25  mg total) by mouth every 6 (six) hours as needed for nausea.   20 tablet   0     BP 131/62  Pulse 113  Temp 100.5 F (38.1 C) (Oral)  Resp 24  SpO2 98%  LMP 05/24/2012  Physical Exam General: Well-developed, well-nourished female in no acute distress; appearance consistent with age of record HENT: normocephalic, atraumatic Eyes: pupils equal round and reactive to light; extraocular muscles intact Neck: supple Heart: regular rate and rhythm Lungs: clear to auscultation bilaterally Abdomen: soft; nondistended; epigastric tenderness; no masses or hepatosplenomegaly; bowel sounds present Extremities: No deformity; full range of motion; pulses normal Neurologic: Awake, alert and oriented; motor function intact in all extremities and symmetric; no facial droop Skin: Warm and dry     ED Course  Procedures (including critical care time)     MDM   Nursing notes and vitals signs, including pulse oximetry, reviewed.  Summary of this visit's results, reviewed by myself:  Labs:  Results for orders placed during the hospital encounter of 06/04/12 (from the past 24 hour(s))  CBC WITH DIFFERENTIAL     Status: Abnormal   Collection Time   06/04/12  2:55 AM  Component Value Range   WBC 7.2  4.0 - 10.5 K/uL   RBC 4.58  3.87 - 5.11 MIL/uL   Hemoglobin 11.1 (*) 12.0 - 15.0 g/dL   HCT 16.1 (*) 09.6 - 04.5 %   MCV 74.5 (*) 78.0 - 100.0 fL   MCH 24.2 (*) 26.0 - 34.0 pg   MCHC 32.6  30.0 - 36.0 g/dL   RDW 40.9  81.1 - 91.4 %   Platelets 433 (*) 150 - 400 K/uL   Neutrophils Relative 68  43 - 77 %   Lymphocytes Relative 26  12 - 46 %   Monocytes Relative 6  3 - 12 %   Eosinophils Relative 0  0 - 5 %   Basophils Relative 0  0 - 1 %   Neutro Abs 4.9  1.7 - 7.7 K/uL   Lymphs Abs 1.9  0.7 - 4.0 K/uL   Monocytes Absolute 0.4  0.1 - 1.0 K/uL   Eosinophils Absolute 0.0  0.0 - 0.7 K/uL   Basophils Absolute 0.0  0.0 - 0.1 K/uL   RBC Morphology ELLIPTOCYTES     WBC Morphology ATYPICAL  LYMPHOCYTES    COMPREHENSIVE METABOLIC PANEL     Status: Abnormal   Collection Time   06/04/12  2:55 AM      Component Value Range   Sodium 131 (*) 135 - 145 mEq/L   Potassium 3.5  3.5 - 5.1 mEq/L   Chloride 95 (*) 96 - 112 mEq/L   CO2 22  19 - 32 mEq/L   Glucose, Bld 120 (*) 70 - 99 mg/dL   BUN 8  6 - 23 mg/dL   Creatinine, Ser 7.82  0.50 - 1.10 mg/dL   Calcium 9.0  8.4 - 95.6 mg/dL   Total Protein 7.9  6.0 - 8.3 g/dL   Albumin 3.7  3.5 - 5.2 g/dL   AST 33  0 - 37 U/L   ALT 22  0 - 35 U/L   Alkaline Phosphatase 69  39 - 117 U/L   Total Bilirubin 0.5  0.3 - 1.2 mg/dL   GFR calc non Af Amer >90  >90 mL/min   GFR calc Af Amer >90  >90 mL/min   6:52 AM Patient drinking fluids without emesis. Patient feels better after 2 L normal saline bolus. She was advised to continue her Tamiflu.         Hanley Seamen, MD 06/04/12 [phone]

## 2012-06-04 NOTE — ED Notes (Signed)
Attempted labs unsuccessfully. ?

## 2012-11-06 ENCOUNTER — Encounter (HOSPITAL_COMMUNITY): Payer: Self-pay | Admitting: *Deleted

## 2012-11-06 ENCOUNTER — Emergency Department (HOSPITAL_COMMUNITY)
Admission: EM | Admit: 2012-11-06 | Discharge: 2012-11-06 | Disposition: A | Discharge status: Home / Self Care | Payer: Self-pay | Attending: Emergency Medicine | Admitting: Emergency Medicine

## 2012-11-06 DIAGNOSIS — J45901 Unspecified asthma with (acute) exacerbation: Secondary | ICD-10-CM | POA: Insufficient documentation

## 2012-11-06 DIAGNOSIS — Z79899 Other long term (current) drug therapy: Secondary | ICD-10-CM | POA: Insufficient documentation

## 2012-11-06 DIAGNOSIS — F17200 Nicotine dependence, unspecified, uncomplicated: Secondary | ICD-10-CM | POA: Insufficient documentation

## 2012-11-06 DIAGNOSIS — J209 Acute bronchitis, unspecified: Secondary | ICD-10-CM

## 2012-11-06 DIAGNOSIS — R05 Cough: Secondary | ICD-10-CM | POA: Insufficient documentation

## 2012-11-06 DIAGNOSIS — R059 Cough, unspecified: Secondary | ICD-10-CM | POA: Insufficient documentation

## 2012-11-06 HISTORY — DX: Unspecified asthma, uncomplicated: J45.909

## 2012-11-06 MED ORDER — IPRATROPIUM BROMIDE 0.02 % IN SOLN
0.5000 mg | Freq: Once | RESPIRATORY_TRACT | Status: AC
  Administered 2012-11-06: 0.5 mg via RESPIRATORY_TRACT
  Filled 2012-11-06: qty 2.5

## 2012-11-06 MED ORDER — AZITHROMYCIN 250 MG PO TABS: 250.0000 mg | ORAL_TABLET | Freq: Every day | ORAL | Status: DC

## 2012-11-06 MED ORDER — ALBUTEROL SULFATE HFA 108 (90 BASE) MCG/ACT IN AERS
2.0000 | INHALATION_SPRAY | RESPIRATORY_TRACT | Status: DC | PRN
  Administered 2012-11-06: 2 via RESPIRATORY_TRACT
  Filled 2012-11-06: qty 6.7

## 2012-11-06 MED ORDER — ALBUTEROL SULFATE (5 MG/ML) 0.5% IN NEBU
5.0000 mg | INHALATION_SOLUTION | Freq: Once | RESPIRATORY_TRACT | Status: AC
  Administered 2012-11-06: 5 mg via RESPIRATORY_TRACT

## 2012-11-06 MED ORDER — PREDNISONE 20 MG PO TABS
60.0000 mg | ORAL_TABLET | Freq: Once | ORAL | Status: AC
  Administered 2012-11-06: 60 mg via ORAL
  Filled 2012-11-06: qty 3

## 2012-11-06 MED ORDER — AZITHROMYCIN 250 MG PO TABS
500.0000 mg | ORAL_TABLET | Freq: Once | ORAL | Status: AC
  Administered 2012-11-06: 500 mg via ORAL
  Filled 2012-11-06: qty 2

## 2012-11-06 MED ORDER — ALBUTEROL SULFATE (5 MG/ML) 0.5% IN NEBU
5.0000 mg | INHALATION_SOLUTION | Freq: Once | RESPIRATORY_TRACT | Status: AC
  Filled 2012-11-06: qty 1

## 2012-11-06 MED ORDER — ALBUTEROL SULFATE HFA 108 (90 BASE) MCG/ACT IN AERS: 1.0000 | INHALATION_SPRAY | Freq: Four times a day (QID) | RESPIRATORY_TRACT | Status: DC | PRN

## 2012-11-06 NOTE — ED Notes (Signed)
NAD noted at time of d/c home 

## 2012-11-06 NOTE — ED Notes (Signed)
Pt has asthma and is here with sob, cough, and wheezing.  Pt is out of her inhalers and states she has caught a cold from her kids

## 2012-11-06 NOTE — ED Notes (Signed)
Family at bedside. 

## 2012-11-06 NOTE — ED Provider Notes (Signed)
Medical screening examination/treatment/procedure(s) were performed by non-physician practitioner and as supervising physician I was immediately available for consultation/collaboration.    Gilda Crease, MD 11/06/12 1019

## 2012-11-06 NOTE — ED Provider Notes (Signed)
History     CSN: [id number]  Arrival date & time 11/06/12  0830   First MD Initiated Contact with Patient 11/06/12 [phone]      Chief Complaint  Patient presents with  . Shortness of Breath    (Consider location/radiation/quality/duration/timing/severity/associated sxs/prior treatment) HPI  Caitlin [name] is a 27 y.o.female presenting to the ER with complaints of asthma exacerbation. She endorses being sick with cough and wheezing. She has a history of asthma but is out of her inhaler. Her family members have been sick at home and she believes she has caught their cold. She denies fevers, nausea, vomiting, diarrhea, chills, CP, dysuria. Patient appears anxious. vitals are WNL.   Past Medical History  Diagnosis Date  . Asthma     Past Surgical History  Procedure Laterality Date  . Cesarean section    . Cholecystectomy    . Tubal ligation      No family history on file.  History  Substance Use Topics  . Smoking status: Current Every Day Smoker  . Smokeless tobacco: Not on file  . Alcohol Use: No    OB History   Grav Para Term Preterm Abortions TAB SAB Ect Mult Living                  Review of Systems  Respiratory: Positive for cough and wheezing.   All other systems reviewed and are negative.    Allergies  Review of patient's allergies indicates no known allergies.  Home Medications   Current Outpatient Rx  Name  Route  Sig  Dispense  Refill  . albuterol (PROVENTIL HFA;VENTOLIN HFA) 108 (90 BASE) MCG/ACT inhaler   Inhalation   Inhale 1 puff into the lungs every 4 (four) hours as needed. Shortness of breath         . albuterol (PROVENTIL HFA;VENTOLIN HFA) 108 (90 BASE) MCG/ACT inhaler   Inhalation   Inhale 1-2 puffs into the lungs every 6 (six) hours as needed for wheezing.   1 Inhaler   4   . azithromycin (ZITHROMAX) 250 MG tablet   Oral   Take 1 tablet (250 mg total) by mouth daily.   4 tablet   0     BP 94/73  Pulse 85  Temp(Src) 98.7  F (37.1 C) (Oral)  Resp 18  SpO2 100%  Physical Exam  Nursing note and vitals reviewed. Constitutional: She appears well-developed and well-nourished. No distress.  HENT:  Head: Normocephalic and atraumatic.  Eyes: Pupils are equal, round, and reactive to light.  Neck: Normal range of motion. Neck supple.  Cardiovascular: Normal rate and regular rhythm.   Pulmonary/Chest: Effort normal. No respiratory distress. She has wheezes (mild wheezing, good aireway movement). She has no rales.  Abdominal: Soft.  Neurological: She is alert.  Skin: Skin is warm and dry.    ED Course  Procedures (including critical care time)  Labs Reviewed - No data to display No results found.   1. Bronchitis with bronchospasm       MDM  Pt expresses that albuterol treatment completely relieved her symptoms. She feels much better.  Given Azithro and albuterol HFA in ed  Rx; Albuterol and Azithro  26 y.o.Caitlin [name]'s evaluation in the Emergency Department is complete. It has been determined that no acute conditions requiring further emergency intervention are present at this time. The patient/guardian have been advised of the diagnosis and plan. We have discussed signs and symptoms that warrant return to the ED, such as  changes or worsening in symptoms.  Vital signs are stable at discharge. Filed Vitals:   11/06/12 0945  BP: 94/73  Pulse: 85  Temp:   Resp:     Patient/guardian has voiced understanding and agreed to follow-up with the PCP or specialist.        Caitlin Matas, PA-C 11/06/12 1018

## 2012-12-18 ENCOUNTER — Emergency Department (HOSPITAL_COMMUNITY): Payer: Self-pay

## 2012-12-18 ENCOUNTER — Emergency Department (HOSPITAL_COMMUNITY)
Admission: EM | Admit: 2012-12-18 | Discharge: 2012-12-19 | Disposition: A | Discharge status: Home / Self Care | Payer: Self-pay | Attending: Emergency Medicine | Admitting: Emergency Medicine

## 2012-12-18 DIAGNOSIS — S0990XA Unspecified injury of head, initial encounter: Secondary | ICD-10-CM | POA: Insufficient documentation

## 2012-12-18 DIAGNOSIS — J45909 Unspecified asthma, uncomplicated: Secondary | ICD-10-CM | POA: Insufficient documentation

## 2012-12-18 DIAGNOSIS — F17200 Nicotine dependence, unspecified, uncomplicated: Secondary | ICD-10-CM | POA: Insufficient documentation

## 2012-12-18 MED ORDER — ACETAMINOPHEN 325 MG PO TABS
650.0000 mg | ORAL_TABLET | Freq: Once | ORAL | Status: AC
  Administered 2012-12-18: 650 mg via ORAL
  Filled 2012-12-18: qty 2

## 2012-12-18 NOTE — ED Notes (Signed)
C-collar placed on pt.
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
## 2012-12-18 NOTE — ED Notes (Signed)
Patient transported to CT 
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
## 2012-12-23 NOTE — ED Provider Notes (Signed)
Medical screening examination/treatment/procedure(s) were performed by non-physician practitioner and as supervising physician I was immediately available for consultation/collaboration.   Lyndy Russman J. Kishaun Erekson, MD 12/23/12 1501 

## 2014-12-12 ENCOUNTER — Encounter (HOSPITAL_COMMUNITY): Payer: Self-pay | Admitting: *Deleted

## 2014-12-12 ENCOUNTER — Emergency Department (HOSPITAL_COMMUNITY): Payer: Self-pay

## 2014-12-12 ENCOUNTER — Emergency Department (HOSPITAL_COMMUNITY)
Admission: EM | Admit: 2014-12-12 | Discharge: 2014-12-12 | Disposition: A | Discharge status: Home / Self Care | Payer: Self-pay | Attending: Emergency Medicine | Admitting: Emergency Medicine

## 2014-12-12 DIAGNOSIS — S60414A Abrasion of right ring finger, initial encounter: Secondary | ICD-10-CM | POA: Insufficient documentation

## 2014-12-12 DIAGNOSIS — Z720 Tobacco use: Secondary | ICD-10-CM | POA: Insufficient documentation

## 2014-12-12 DIAGNOSIS — Y998 Other external cause status: Secondary | ICD-10-CM | POA: Insufficient documentation

## 2014-12-12 DIAGNOSIS — T148XXA Other injury of unspecified body region, initial encounter: Secondary | ICD-10-CM

## 2014-12-12 DIAGNOSIS — S60412A Abrasion of right middle finger, initial encounter: Secondary | ICD-10-CM | POA: Insufficient documentation

## 2014-12-12 DIAGNOSIS — Y9389 Activity, other specified: Secondary | ICD-10-CM | POA: Insufficient documentation

## 2014-12-12 DIAGNOSIS — S6991XA Unspecified injury of right wrist, hand and finger(s), initial encounter: Secondary | ICD-10-CM | POA: Insufficient documentation

## 2014-12-12 DIAGNOSIS — Y9289 Other specified places as the place of occurrence of the external cause: Secondary | ICD-10-CM | POA: Insufficient documentation

## 2014-12-12 DIAGNOSIS — J45909 Unspecified asthma, uncomplicated: Secondary | ICD-10-CM | POA: Insufficient documentation

## 2014-12-12 DIAGNOSIS — S60416A Abrasion of right little finger, initial encounter: Secondary | ICD-10-CM | POA: Insufficient documentation

## 2014-12-12 DIAGNOSIS — W228XXA Striking against or struck by other objects, initial encounter: Secondary | ICD-10-CM | POA: Insufficient documentation

## 2014-12-12 DIAGNOSIS — Z79899 Other long term (current) drug therapy: Secondary | ICD-10-CM | POA: Insufficient documentation

## 2014-12-12 MED ORDER — OXYCODONE-ACETAMINOPHEN 5-325 MG PO TABS: 2.0000 | ORAL_TABLET | ORAL | Status: DC | PRN

## 2014-12-12 MED ORDER — BACITRACIN 500 UNIT/GM EX OINT
1.0000 "application " | TOPICAL_OINTMENT | Freq: Once | CUTANEOUS | Status: AC
  Administered 2014-12-12: 1 via TOPICAL
  Filled 2014-12-12: qty 0.9

## 2014-12-12 MED ORDER — OXYCODONE-ACETAMINOPHEN 5-325 MG PO TABS
2.0000 | ORAL_TABLET | Freq: Once | ORAL | Status: AC
  Administered 2014-12-12: 2 via ORAL
  Filled 2014-12-12: qty 2

## 2014-12-12 NOTE — ED Notes (Signed)
Ice applied to hand
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
## 2014-12-12 NOTE — ED Notes (Signed)
Declined W/C at D/C and was escorted to lobby by RN. 
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
## 2014-12-12 NOTE — Discharge Instructions (Signed)
Abrasion Take tylenol or motrin for pain and percocet for breakthrough pain. Follow up with a pediatrician for further evaluation.  An abrasion is a cut or scrape of the skin. Abrasions do not extend through all layers of the skin and most heal within 10 days. It is important to care for your abrasion properly to prevent infection. CAUSES  Most abrasions are caused by falling on, or gliding across, the ground or other surface. When your skin rubs on something, the outer and inner layer of skin rubs off, causing an abrasion. DIAGNOSIS  Your caregiver will be able to diagnose an abrasion during a physical exam.  TREATMENT  Your treatment depends on how large and deep the abrasion is. Generally, your abrasion will be cleaned with water and a mild soap to remove any dirt or debris. An antibiotic ointment may be put over the abrasion to prevent an infection. A bandage (dressing) may be wrapped around the abrasion to keep it from getting dirty.  You may need a tetanus shot if:  You cannot remember when you had your last tetanus shot.  You have never had a tetanus shot.  The injury broke your skin. If you get a tetanus shot, your arm may swell, get red, and feel warm to the touch. This is common and not a problem. If you need a tetanus shot and you choose not to have one, there is a rare chance of getting tetanus. Sickness from tetanus can be serious.  HOME CARE INSTRUCTIONS   If a dressing was applied, change it at least once a day or as directed by your caregiver. If the bandage sticks, soak it off with warm water.   Wash the area with water and a mild soap to remove all the ointment 2 times a day. Rinse off the soap and pat the area dry with a clean towel.   Reapply any ointment as directed by your caregiver. This will help prevent infection and keep the bandage from sticking. Use gauze over the wound and under the dressing to help keep the bandage from sticking.   Change your dressing right  away if it becomes wet or dirty.   Only take over-the-counter or prescription medicines for pain, discomfort, or fever as directed by your caregiver.   Follow up with your caregiver within 24-48 hours for a wound check, or as directed. If you were not given a wound-check appointment, look closely at your abrasion for redness, swelling, or pus. These are signs of infection. SEEK IMMEDIATE MEDICAL CARE IF:   You have increasing pain in the wound.   You have redness, swelling, or tenderness around the wound.   You have pus coming from the wound.   You have a fever or persistent symptoms for more than 2-3 days.  You have a fever and your symptoms suddenly get worse.  You have a bad smell coming from the wound or dressing.  MAKE SURE YOU:   Understand these instructions.  Will watch your condition.  Will get help right away if you are not doing well or get worse. Document Released: 02/22/2005 Document Revised: 05/01/2012 Document Reviewed: 04/18/2011 [facility] Patient Information 2015 [place], [place]. This information is not intended to replace advice given to you by your health care provider. Make sure you discuss any questions you have with your health care provider.  Emergency Department Resource Guide 1) Find a Doctor and Pay Out of Pocket Although you won't have to find out who is covered by your  insurance plan, it is a good idea to ask around and get recommendations. You will then need to call the office and see if the doctor you have chosen will accept you as a new patient and what types of options they offer for patients who are self-pay. Some doctors offer discounts or will set up payment plans for their patients who do not have insurance, but you will need to ask so you aren't surprised when you get to your appointment.  2) Contact Your Local Health Department Not all health departments have doctors that can see patients for sick visits, but many do, so it is worth a call  to see if yours does. If you don't know where your local health department is, you can check in your phone book. The CDC also has a tool to help you locate your state's health department, and many state websites also have listings of all of their local health departments.  3) Find a Walk-in Clinic If your illness is not likely to be very severe or complicated, you may want to try a walk in clinic. These are popping up all over the country in pharmacies, drugstores, and shopping centers. They're usually staffed by nurse practitioners or physician assistants that have been trained to treat common illnesses and complaints. They're usually fairly quick and inexpensive. However, if you have serious medical issues or chronic medical problems, these are probably not your best option.  No Primary Care Doctor: - Call Health Connect at  [phone] - they can help you locate a primary care doctor that  accepts your insurance, provides certain services, etc. - Physician Referral Service- [phone]  Chronic Pain Problems: Organization         Address  Phone   Notes  [facility]  [phone] Patients need to be referred by their primary care doctor.   Medication Assistance: Organization         Address  Phone   Notes  [facility] Medication [facility] [address] [place] [place], [place] [address] [phone] --Must be a resident of [facility] -- Must have NO insurance coverage whatsoever (no Medicaid/ Medicare, etc.) -- The pt. MUST have a primary care doctor that directs their care regularly and follows them in the community   [facility]  [phone]   [employer]  [phone]    Agencies that provide inexpensive medical care: Organization         Address  Phone   Notes  [name]  [phone]   [name]    [phone]   [facility] [address] [place], [place] [address] [phone]   [facility] [address] [place] [place] [phone]   Planned Parenthood    [phone]   [facility]    [phone]   [facility]  [address], [place] Phone:  [phone], Fax:  [phone] Hours of Operation:  9 am - 6 pm, M-F.  Also accepts Medicaid/Medicare and self-pay.  [facility]  [address], [place] Phone: [phone], Fax: [phone]. Hours of Operation:  8:30 am - 5:30 pm, M-F.  Also accepts Medicaid and self-pay.  [facility] [place] [address] [place] Phone: [phone]   [facility] [address] Natasha Bence [place], [place] [phone], Ext. [phone] Mondays &  Thursdays: 7-9 AM.  First 15 patients are seen on a first come, first serve basis.    Medicaid-accepting [facility] Providers:  Organization         Address  Phone   Notes  [facility] [address], [place] [phone] Also accepts self-pay patients.  [facility] [address] Laurell Josephs [place], [place]  [phone]   [facility] [address], [place] [phone]   [facility] Family Medicine [address] [place] [phone]   Renaye Rakers [address], [place]   [phone] Only accepts [place] patients after they have their name applied to their card.   Self-Pay (no insurance) in [facility]:  Organization         Address  Phone   Notes  Sickle Cell Patients, [facility] Internal Medicine [address] [place], [place] [phone]   [facility] Urgent Care [address] [place], [place] [phone]   [name] [place]  [address], [place] [phone]   [facility]/Dr. Osei-Bonsu  [address] [place] or [address], [place] [phone] Phone number for both [place] and [place] locations is the same.  Urgent Medical and [facility] [address], [place] [phone]   [facility] [address], [place] or [address] [phone]   [facility] [address] [place] [phone], phone; [phone], fax Sees patients 1st and 3rd Saturday of every month.  Must not qualify for public or private insurance (i.e. Medicaid, Medicare, [place] Health Choice, Veterans' Benefits)  Household income should be no more than 200% of the poverty level The clinic cannot treat you if you are pregnant or think you are pregnant  Sexually transmitted diseases are not treated at the clinic.    Dental Care: Organization         Address  Phone  Notes  [facility] Department of [facility] [address] [place], [place] [phone] Accepts children up to age 42 who are enrolled in [place] or [place] Health Choice; pregnant women with a Medicaid card; and children who have applied for Medicaid or [place] Health Choice, but were declined, whose parents can pay a reduced fee at time of service.  [facility] Department of [facility]  [address], [place] [phone] Accepts children up to age 25 who are enrolled in [place] or [place] Health Choice; pregnant women with a Medicaid card; and children who have applied for Medicaid or [place] Health Choice, but were declined, whose parents can pay a reduced fee at time of service.  [facility]  [address] [place], [place] [phone] Patients are seen by appointment only. Walk-ins are not accepted. [facility] will see patients 46 years of age and older. Monday - Tuesday (8am-5pm) Most Wednesdays (8:30-5pm) $30 per visit, cash only  [facility]  [address], [facility] [phone] Patients are  seen by appointment only. Walk-ins are not accepted. [facility] will see patients 46 years of age and older. One Wednesday Evening (Monthly: Volunteer Based).  $30 per visit, cash only  [employer]  [phone] for adults; Children under  age 10, call Graduate Pediatric Dentistry at [phone]. Children aged 15-14, please call [phone] to request a pediatric application.  Dental services are provided in all areas of dental care including fillings, crowns and bridges, complete and partial dentures, implants, gum treatment, root canals, and extractions. Preventive care is also provided. Treatment is provided to both adults and children. Patients are selected via a lottery and there is often a waiting list.   [facility] [address] [place]  [phone] [email or web address]   [facility] [address] [place], [place] [phone], Ext. [phone] Second and Fourth Thursday of each month, opens at 6:30 AM; Clinic ends at 9 AM.  Patients are seen on a first-come first-served basis, and a limited number are seen during each clinic.   [facility]  [address] Ether Griffins [place], [place] [phone]   Eligibility Requirements You must have lived in [place], [place], or [place] counties for at least the last three months.   You cannot be eligible for state or federal sponsored [place], including [employer], [place], or [employer].   You generally cannot be eligible for healthcare insurance through your employer.    How to apply: Eligibility screenings are held every Tuesday and Wednesday afternoon from 1:00 pm until 4:00 pm. You do not need an appointment for the interview!  [facility] [address] [place], [place] [phone]   [facility] Health Department  [phone]   [facility] Health Department  [phone]   [facility] Department  [phone]     Behavioral Health Resources in the Community: Intensive Outpatient Programs Organization         Address  Phone  Notes  [facility] [address], [place], [place] [phone]   [facility] Outpatient [address], [place], [place] [phone]   [facility] [address] [place], [place]  [phone]   [facility] Mental Health [address]  [place], [place] [phone] or [phone]   Substance Abuse Resources Organization         Address  Phone  Notes  [facility]  [phone]   [facility]  [phone]   [facility]  [phone]   Floydene Flock  [phone]   Residential & Outpatient Substance Abuse Program  [phone]   Psychological Services Organization         Address  Phone  Notes  [facility] Behavioral Health  [phone]   [facility]  [phone]   [facility] Mental Health [address] [place] [phone] or [phone]    Mobile Crisis Teams Organization         Address  Phone  Notes  [facility], Mobile Crisis Care Unit  [phone]   [facility]  [address] [place], [place] [phone]   Doristine Locks [address] [place] [phone]    Self-Help/Support Groups Organization         Address  Phone             Notes  [facility] [place] - variety of support groups  336- I7437963 Call for more information  Narcotics Anonymous (NA), [facility] [address], [employer] [place]  2 meetings at this location   Chief Executive Officer  Notes  [facility] [phone] [address],    [place]  [phone]   [facility]  [address] [place] [phone], [place], [place] [phone]   [facility] [address], [place] [phone] Admissions: 8am-3pm M-F  Incentives  [facility] Treatment [facility] [address]    [place], [place] [phone]   [facility] [address], [place], [place] [phone]   [facility] [address]  [place], [place] [phone]   [facility] - Intensive Outpatient [address], [place], [place] [phone]   [facility] ([facility] [address]  [place], [place] [phone] or [phone]   [facility] ([facility]) [address] [place], [place] [phone] Accepts Medicaid  [facility] [address]  [place] [phone] Substance Abuse/Addiction Treatment   [facility] Behavioral Health Resources Organization         Address  Phone  Notes  [facility]  [phone](888) [phone]   Angie FavaJulie Brannon, PhD [address] [place], [place]   [phone]) [phone] or [phone](336) [phone]   [facility]   [address] [place], [place] [phone](336) [phone]   [facility] [address], [place], [place] [phone](336) [phone] Insurance/Medicaid/sponsorship through [facility]  [facility] [address]                                    [place], [place] [phone](336) [phone] Therapy/tele-psych/case  [facility] [address]   [place], [place] [phone]) [phone]    Dr. Lolly MustacheArfeen  [phone]) [phone]   [facility]  [employer] [facility] 1) [address], [place] 2) [address], [place] 3)  [address], [place] [phone]) [phone](336) [phone]  [phone](336) [phone]   [facility] Child Abuse Hotline [phone](336) [phone] or [phone](336) [phone] (After Hours)

## 2015-09-27 ENCOUNTER — Emergency Department (HOSPITAL_COMMUNITY)
Admission: EM | Admit: 2015-09-27 | Discharge: 2015-09-27 | Disposition: A | Discharge status: Home / Self Care | Payer: No Typology Code available for payment source | Attending: Emergency Medicine | Admitting: Emergency Medicine

## 2015-09-27 ENCOUNTER — Encounter (HOSPITAL_COMMUNITY): Payer: Self-pay | Admitting: Emergency Medicine

## 2015-09-27 ENCOUNTER — Emergency Department (HOSPITAL_COMMUNITY): Payer: No Typology Code available for payment source

## 2015-09-27 DIAGNOSIS — M542 Cervicalgia: Secondary | ICD-10-CM

## 2015-09-27 DIAGNOSIS — Z79899 Other long term (current) drug therapy: Secondary | ICD-10-CM | POA: Diagnosis not present

## 2015-09-27 DIAGNOSIS — S29001A Unspecified injury of muscle and tendon of front wall of thorax, initial encounter: Secondary | ICD-10-CM | POA: Diagnosis not present

## 2015-09-27 DIAGNOSIS — M549 Dorsalgia, unspecified: Secondary | ICD-10-CM

## 2015-09-27 DIAGNOSIS — S8991XA Unspecified injury of right lower leg, initial encounter: Secondary | ICD-10-CM | POA: Insufficient documentation

## 2015-09-27 DIAGNOSIS — S199XXA Unspecified injury of neck, initial encounter: Secondary | ICD-10-CM | POA: Diagnosis not present

## 2015-09-27 DIAGNOSIS — S4992XA Unspecified injury of left shoulder and upper arm, initial encounter: Secondary | ICD-10-CM | POA: Diagnosis not present

## 2015-09-27 DIAGNOSIS — Y9389 Activity, other specified: Secondary | ICD-10-CM | POA: Insufficient documentation

## 2015-09-27 DIAGNOSIS — S3992XA Unspecified injury of lower back, initial encounter: Secondary | ICD-10-CM | POA: Insufficient documentation

## 2015-09-27 DIAGNOSIS — S0990XA Unspecified injury of head, initial encounter: Secondary | ICD-10-CM | POA: Diagnosis not present

## 2015-09-27 DIAGNOSIS — S99921A Unspecified injury of right foot, initial encounter: Secondary | ICD-10-CM | POA: Insufficient documentation

## 2015-09-27 DIAGNOSIS — J45909 Unspecified asthma, uncomplicated: Secondary | ICD-10-CM | POA: Insufficient documentation

## 2015-09-27 DIAGNOSIS — Z3202 Encounter for pregnancy test, result negative: Secondary | ICD-10-CM | POA: Insufficient documentation

## 2015-09-27 DIAGNOSIS — F17210 Nicotine dependence, cigarettes, uncomplicated: Secondary | ICD-10-CM | POA: Insufficient documentation

## 2015-09-27 DIAGNOSIS — Y92410 Unspecified street and highway as the place of occurrence of the external cause: Secondary | ICD-10-CM | POA: Diagnosis not present

## 2015-09-27 DIAGNOSIS — M25512 Pain in left shoulder: Secondary | ICD-10-CM

## 2015-09-27 DIAGNOSIS — Y998 Other external cause status: Secondary | ICD-10-CM | POA: Insufficient documentation

## 2015-09-27 DIAGNOSIS — M25561 Pain in right knee: Secondary | ICD-10-CM

## 2015-09-27 DIAGNOSIS — S3991XA Unspecified injury of abdomen, initial encounter: Secondary | ICD-10-CM | POA: Diagnosis not present

## 2015-09-27 DIAGNOSIS — M79671 Pain in right foot: Secondary | ICD-10-CM

## 2015-09-27 LAB — BASIC METABOLIC PANEL
ANION GAP: 9 (ref 5–15)
BUN: 6 mg/dL (ref 6–20)
CHLORIDE: 105 mmol/L (ref 101–111)
CO2: 25 mmol/L (ref 22–32)
Calcium: 9.5 mg/dL (ref 8.9–10.3)
Creatinine, Ser: 0.7 mg/dL (ref 0.44–1.00)
GFR calc non Af Amer: >60 mL/min (ref >60)
Glucose, Bld: 83 mg/dL (ref 65–99)
Potassium: 4.1 mmol/L (ref 3.5–5.1)
Sodium: 139 mmol/L (ref 135–145)

## 2015-09-27 LAB — CBC WITH DIFFERENTIAL/PLATELET
BASOS ABS: 0 10*3/uL (ref 0.0–0.1)
BASOS PCT: 0 %
Eosinophils Absolute: 0.3 10*3/uL (ref 0.0–0.7)
Eosinophils Relative: 5 %
HEMATOCRIT: 34.6 % — AB (ref 36.0–46.0)
HEMOGLOBIN: 10.8 g/dL — AB (ref 12.0–15.0)
LYMPHS PCT: 52 %
Lymphs Abs: 3.1 10*3/uL (ref 0.7–4.0)
MCH: 24.8 pg — ABNORMAL LOW (ref 26.0–34.0)
MCHC: 31.2 g/dL (ref 30.0–36.0)
MCV: 79.4 fL (ref 78.0–100.0)
MONOS PCT: 6 %
Monocytes Absolute: 0.4 10*3/uL (ref 0.1–1.0)
NEUTROS ABS: 2.2 10*3/uL (ref 1.7–7.7)
NEUTROS PCT: 37 %
Platelets: 408 10*3/uL — ABNORMAL HIGH (ref 150–400)
RBC: 4.36 MIL/uL (ref 3.87–5.11)
RDW: 14.3 % (ref 11.5–15.5)
WBC: 6 10*3/uL (ref 4.0–10.5)

## 2015-09-27 LAB — I-STAT BETA HCG BLOOD, ED (MC, WL, AP ONLY)

## 2015-09-27 MED ORDER — MORPHINE SULFATE (PF) 4 MG/ML IV SOLN
4.0000 mg | Freq: Once | INTRAVENOUS | Status: AC
  Administered 2015-09-27: 4 mg via INTRAVENOUS
  Filled 2015-09-27: qty 1

## 2015-09-27 MED ORDER — OXYCODONE-ACETAMINOPHEN 5-325 MG PO TABS: 1.0000 | ORAL_TABLET | ORAL | Status: DC | PRN

## 2015-09-27 MED ORDER — ONDANSETRON HCL 4 MG/2ML IJ SOLN
4.0000 mg | Freq: Once | INTRAMUSCULAR | Status: AC
  Administered 2015-09-27: 4 mg via INTRAVENOUS

## 2015-09-27 MED ORDER — METHOCARBAMOL 500 MG PO TABS: 500.0000 mg | ORAL_TABLET | Freq: Two times a day (BID) | ORAL | Status: DC

## 2015-09-27 MED ORDER — METHOCARBAMOL 500 MG PO TABS
500.0000 mg | ORAL_TABLET | Freq: Once | ORAL | Status: AC
  Administered 2015-09-27: 500 mg via ORAL
  Filled 2015-09-27: qty 1

## 2015-09-27 MED ORDER — ONDANSETRON HCL 4 MG/2ML IJ SOLN
INTRAMUSCULAR | Status: AC
  Filled 2015-09-27: qty 2

## 2015-09-27 MED ORDER — IOPAMIDOL (ISOVUE-300) INJECTION 61%
INTRAVENOUS | Status: AC
  Filled 2015-09-27: qty 100

## 2015-09-27 NOTE — Progress Notes (Signed)
Orthopedic Tech Progress Note Patient Details:  Caitlin [name] 09/23/1985 [id number]  Ortho Devices Type of Ortho Device: Arm sling, Postop shoe/boot, Knee Sleeve Ortho Device/Splint Interventions: Application   Saul FordyceJennifer C Rembert Browe 09/27/2015, 4:39 PM
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
## 2015-09-27 NOTE — ED Notes (Signed)
Patient transported to X-ray 
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
## 2015-10-04 ENCOUNTER — Ambulatory Visit (HOSPITAL_COMMUNITY)
Admission: EM | Admit: 2015-10-04 | Discharge: 2015-10-04 | Disposition: A | Discharge status: Home / Self Care | Payer: No Typology Code available for payment source | Attending: Emergency Medicine | Admitting: Emergency Medicine

## 2015-10-04 ENCOUNTER — Encounter (HOSPITAL_COMMUNITY): Payer: Self-pay | Admitting: Nurse Practitioner

## 2015-10-04 DIAGNOSIS — F0781 Postconcussional syndrome: Secondary | ICD-10-CM

## 2015-10-04 DIAGNOSIS — R51 Headache: Secondary | ICD-10-CM

## 2015-10-04 DIAGNOSIS — M5431 Sciatica, right side: Secondary | ICD-10-CM

## 2015-10-04 DIAGNOSIS — M62830 Muscle spasm of back: Secondary | ICD-10-CM

## 2015-10-04 DIAGNOSIS — R519 Headache, unspecified: Secondary | ICD-10-CM

## 2015-10-04 MED ORDER — CYCLOBENZAPRINE HCL 10 MG PO TABS: 10.0000 mg | ORAL_TABLET | Freq: Three times a day (TID) | ORAL | Status: DC | PRN

## 2015-10-04 MED ORDER — METHYLPREDNISOLONE ACETATE 80 MG/ML IJ SUSP
INTRAMUSCULAR | Status: AC
  Filled 2015-10-04: qty 1

## 2015-10-04 MED ORDER — METHYLPREDNISOLONE ACETATE 80 MG/ML IJ SUSP
80.0000 mg | Freq: Once | INTRAMUSCULAR | Status: AC
  Administered 2015-10-04: 80 mg via INTRAMUSCULAR

## 2015-10-04 MED ORDER — OXYCODONE-ACETAMINOPHEN 5-325 MG PO TABS: 1.0000 | ORAL_TABLET | ORAL | Status: DC | PRN

## 2015-10-04 MED ORDER — GABAPENTIN 300 MG PO CAPS: 300.0000 mg | ORAL_CAPSULE | Freq: Every day | ORAL | Status: DC

## 2015-10-04 MED ORDER — PREDNISONE 10 MG PO TABS: ORAL_TABLET | ORAL | Status: DC
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
## 2015-10-04 NOTE — Discharge Instructions (Signed)
I'm sorry you're still having so much pain and discomfort. I suspect your headache is coming from a concussion. This can take weeks to months to fully resolve. The pain in your right leg is likely coming from a pinched nerves. Take the prednisone taper as prescribed. Use Flexeril 3 times a day to help with the spasms. This medicine will make you drowsy. Take gabapentin at bedtime. This will help with the leg pain, anxiety, and headache. Use the oxycodone every 4-6 hours as needed for severe pain. Follow-up as needed.

## 2015-10-08 ENCOUNTER — Ambulatory Visit: Payer: Self-pay | Attending: Physician Assistant | Admitting: Physician Assistant

## 2015-10-08 DIAGNOSIS — F4310 Post-traumatic stress disorder, unspecified: Secondary | ICD-10-CM

## 2015-10-08 MED ORDER — ALPRAZOLAM 0.25 MG PO TABS: 0.2500 mg | ORAL_TABLET | Freq: Every evening | ORAL | Status: DC | PRN
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
## 2015-10-13 ENCOUNTER — Ambulatory Visit: Payer: No Typology Code available for payment source | Admitting: Physical Therapy

## 2015-10-13 ENCOUNTER — Telehealth: Payer: Self-pay | Admitting: *Deleted

## 2015-10-13 NOTE — Telephone Encounter (Signed)
Pt. Called requesting a refill on oxyCODONE-acetaminophen (PERCOCET/ROXICET) 5-325 MG tablet. I told pt. That the medication was not prescribed by her PCP and was sure sure if the medication could be refilled.  Please f/u with pt.

## 2015-10-18 ENCOUNTER — Ambulatory Visit: Payer: No Typology Code available for payment source | Admitting: Physical Therapy

## 2015-10-26 ENCOUNTER — Ambulatory Visit (HOSPITAL_COMMUNITY)
Admission: EM | Admit: 2015-10-26 | Discharge: 2015-10-26 | Disposition: A | Discharge status: Home / Self Care | Payer: No Typology Code available for payment source | Attending: Family Medicine | Admitting: Family Medicine

## 2015-10-26 ENCOUNTER — Encounter (HOSPITAL_COMMUNITY): Payer: Self-pay | Admitting: Emergency Medicine

## 2015-10-26 ENCOUNTER — Ambulatory Visit: Payer: No Typology Code available for payment source | Attending: Physician Assistant | Admitting: Physical Therapy

## 2015-10-26 DIAGNOSIS — M791 Myalgia, unspecified site: Secondary | ICD-10-CM

## 2015-10-26 DIAGNOSIS — M5441 Lumbago with sciatica, right side: Secondary | ICD-10-CM | POA: Diagnosis present

## 2015-10-26 DIAGNOSIS — M6281 Muscle weakness (generalized): Secondary | ICD-10-CM | POA: Insufficient documentation

## 2015-10-26 DIAGNOSIS — M542 Cervicalgia: Secondary | ICD-10-CM | POA: Diagnosis present

## 2015-10-26 DIAGNOSIS — M5417 Radiculopathy, lumbosacral region: Secondary | ICD-10-CM | POA: Diagnosis not present

## 2015-10-26 DIAGNOSIS — R262 Difficulty in walking, not elsewhere classified: Secondary | ICD-10-CM | POA: Insufficient documentation

## 2015-10-26 DIAGNOSIS — M62830 Muscle spasm of back: Secondary | ICD-10-CM

## 2015-10-26 DIAGNOSIS — M5410 Radiculopathy, site unspecified: Secondary | ICD-10-CM

## 2015-10-26 DIAGNOSIS — M546 Pain in thoracic spine: Secondary | ICD-10-CM

## 2015-10-26 MED ORDER — BUPIVACAINE HCL (PF) 0.5 % IJ SOLN
INTRAMUSCULAR | Status: AC
  Filled 2015-10-26: qty 10

## 2015-10-26 MED ORDER — PREDNISONE 20 MG PO TABS: ORAL_TABLET | ORAL | Status: DC

## 2015-10-26 MED ORDER — LIDOCAINE HCL (PF) 1 % IJ SOLN
INTRAMUSCULAR | Status: AC
  Filled 2015-10-26: qty 5

## 2015-10-26 MED ORDER — HYDROCODONE-ACETAMINOPHEN 7.5-325 MG PO TABS: 1.0000 | ORAL_TABLET | ORAL | Status: DC | PRN

## 2015-10-26 MED ORDER — CYCLOBENZAPRINE HCL 10 MG PO TABS: 10.0000 mg | ORAL_TABLET | Freq: Three times a day (TID) | ORAL | Status: DC | PRN

## 2015-10-26 MED ORDER — GABAPENTIN 300 MG PO CAPS: ORAL_CAPSULE | ORAL | Status: DC

## 2015-10-26 MED ORDER — TRIAMCINOLONE ACETONIDE 40 MG/ML IJ SUSP
INTRAMUSCULAR | Status: AC
  Filled 2015-10-26: qty 1
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
## 2015-10-26 NOTE — Patient Instructions (Signed)
Reducing Load   Copyright  VHI. All rights reserved.  BODY MECHANICS Tips Good body mechanics are important during activities of daily living. The practice of good body mechanics will: -help distribute weight throughout the skeleton in a more anatomically correct manner thus stimulating more normal forces on the bones, and encouraging stronger, healthier, denser bones. -reduce unnatural forces on bones, ligaments, joints and muscles and reduce risk of fracture, other injury or back pain. A WORD ON BODY POSITIONING: Sitting is the hardest position for the back. Lying on the back is the easiest. Standing, in good body alignment, is somewhere between. A good motto is: Sit less, stand more, and, when you can't do that, lie down on your back and exercise to strengthen it.  Copyright  VHI. All rights reserved.       Supine to Sit (Active)   Lie on back, left leg bent. Roll to other side. From side-lying, sit up on side of bed. Complete ___ sets of ___ repetitions. Perform ___ sessions per day.  Copyright  VHI. All rights reserved.    Housework - Reaching Down   If you are unable to bend your knees or squat, use a lazy [place]arl [place]ikes to keep items within easy reach. Store only light, unbreakable items on the lowest shelves, and use a reacher to pick them up.  Copyright  VHI. All rights reserved.  Low Shelf   Squat down, and bring item close to lift.   Copyright  VHI. All rights reserved.  Lifting Principles .Maintain proper posture and head alignment. .Slide object as close as possible before lifting. .Move obstacles out of the way. .Test before lifting; ask for help if too heavy. .Tighten stomach muscles without holding breath. .Use smooth movements; do not jerk. .Use legs to do the work, and pivot with feet. .Distribute the work load symmetrically and close to the center of trunk. .Push instead of pull whenever possible.  Copyright  VHI. All rights reserved.  Posture -  Standing   Good posture is important. Avoid slouching and forward head thrust. Maintain curve in low back and align ears over shoul- ders, hips over ankles.   Copyright  VHI. All rights reserved.   Posture - Sitting   Sit upright, head facing forward. Try using a roll to support lower back. Keep shoulders relaxed, and avoid rounded back. Keep hips level with knees. Avoid crossing legs for long periods.   Copyright  VHI. All rights reserved.  Ideal Posture Use with figures on 3 (2 of 2): 1.Head erect 2.Chin in 3.Chest and navel aligned 4.Spinal curves maintained 5.Knees relaxed 6.Shoulders and hips aligned 7.Feet slightly apart 8.Toes and arches active 9.Abdomen taut (breathe with diaphragm) 10.Arms at sides Ideal posture is: -pain free. -achieved with practice, mindful interest, and body awareness.  Copyright  VHI. All rights reserved.     Move heavy items one at a time, or move portions of the contents.   Posture Awareness     Stand and check posture: Jut chin, pull back to comfortable position. Tilt pelvis forward, back; be sure back is not swayed. Roll from heels to balls of feet, then distribute your weight evenly. Picture a line through spine pulling you erect. Focus on breathing. Good Posture = Better Breathing. Check ____ times per day.  http://gt2.exer.us/873   Copyright  VHI. All rights reserved.        ICE PACK WHILE YOUR LYING FLAT ON YOUR BELLY See your MD about meds, weakness in LE. Once you have  meds, schedule appts.

## 2015-10-26 NOTE — Discharge Instructions (Signed)
Back Pain, Adult °Back pain is very common in adults. The cause of back pain is rarely dangerous and the pain often gets better over time. The cause of your back pain may not be known. Some common causes of back pain include: °· Strain of the muscles or ligaments supporting the spine. °· Wear and tear (degeneration) of the spinal disks. °· Arthritis. °· Direct injury to the back. °For many people, back pain may return. Since back pain is rarely dangerous, most people can learn to manage this condition on their own. °HOME CARE INSTRUCTIONS °Watch your back pain for any changes. The following actions may help to lessen any discomfort you are feeling: °· Remain active. It is stressful on your back to sit or stand in one place for long periods of time. Do not sit, drive, or stand in one place for more than 30 minutes at a time. Take short walks on even surfaces as soon as you are able. Try to increase the length of time you walk each day. °· Exercise regularly as directed by your health care provider. Exercise helps your back heal faster. It also helps avoid future injury by keeping your muscles strong and flexible. °· Do not stay in bed. Resting more than 1-2 days can delay your recovery. °· Pay attention to your body when you bend and lift. The most comfortable positions are those that put less stress on your recovering back. Always use proper lifting techniques, including: °· Bending your knees. °· Keeping the load close to your body. °· Avoiding twisting. °· Find a comfortable position to sleep. Use a firm mattress and lie on your side with your knees slightly bent. If you lie on your back, put a pillow under your knees. °· Avoid feeling anxious or stressed. Stress increases muscle tension and can worsen back pain. It is important to recognize when you are anxious or stressed and learn ways to manage it, such as with exercise. °· Take medicines only as directed by your health care provider. Over-the-counter  medicines to reduce pain and inflammation are often the most helpful. Your health care provider may prescribe muscle relaxant drugs. These medicines help dull your pain so you can more quickly return to your normal activities and healthy exercise. °· Apply ice to the injured area: °· Put ice in a plastic bag. °· Place a towel between your skin and the bag. °· Leave the ice on for 20 minutes, 2-3 times a day for the first 2-3 days. After that, ice and heat may be alternated to reduce pain and spasms. °· Maintain a healthy weight. Excess weight puts extra stress on your back and makes it difficult to maintain good posture. °SEEK MEDICAL CARE IF: °· You have pain that is not relieved with rest or medicine. °· You have increasing pain going down into the legs or buttocks. °· You have pain that does not improve in one week. °· You have night pain. °· You lose weight. °· You have a fever or chills. °SEEK IMMEDIATE MEDICAL CARE IF:  °· You develop new bowel or bladder control problems. °· You have unusual weakness or numbness in your arms or legs. °· You develop nausea or vomiting. °· You develop abdominal pain. °· You feel faint. °  °This information is not intended to replace advice given to you by your health care provider. Make sure you discuss any questions you have with your health care provider. °  °Document Released: 05/15/2005 Document Revised: 06/05/2014 Document Reviewed: 09/16/2013 °Elsevier Interactive Patient Education ©2016 Elsevier   Inc.  Muscle Cramps and Spasms Muscle cramps and spasms occur when a muscle or muscles tighten and you have no control over this tightening (involuntary muscle contraction). They are a common problem and can develop in any muscle. The most common place is in the calf muscles of the leg. Both muscle cramps and muscle spasms are involuntary muscle contractions, but they also have differences:   Muscle cramps are sporadic and painful. They may last a few seconds to a quarter of  an hour. Muscle cramps are often more forceful and last longer than muscle spasms.  Muscle spasms may or may not be painful. They may also last just a few seconds or much longer. CAUSES  It is uncommon for cramps or spasms to be due to a serious underlying problem. In many cases, the cause of cramps or spasms is unknown. Some common causes are:   Overexertion.   Overuse from repetitive motions (doing the same thing over and over).   Remaining in a certain position for a long period of time.   Improper preparation, form, or technique while performing a sport or activity.   Dehydration.   Injury.   Side effects of some medicines.   Abnormally low levels of the salts and ions in your blood (electrolytes), especially potassium and calcium. This could happen if you are taking water pills (diuretics) or you are pregnant.  Some underlying medical problems can make it more likely to develop cramps or spasms. These include, but are not limited to:   Diabetes.   Parkinson disease.   Hormone disorders, such as thyroid problems.   Alcohol abuse.   Diseases specific to muscles, joints, and bones.   Blood vessel disease where not enough blood is getting to the muscles.  HOME CARE INSTRUCTIONS   Stay well hydrated. Drink enough water and fluids to keep your urine clear or pale yellow.  It may be helpful to massage, stretch, and relax the affected muscle.  For tight or tense muscles, use a warm towel, heating pad, or hot shower water directed to the affected area.  If you are sore or have pain after a cramp or spasm, applying ice to the affected area may relieve discomfort.  Put ice in a plastic bag.  Place a towel between your skin and the bag.  Leave the ice on for 15-20 minutes, 03-04 times a day.  Medicines used to treat a known cause of cramps or spasms may help reduce their frequency or severity. Only take over-the-counter or prescription medicines as directed by  your caregiver. SEEK MEDICAL CARE IF:  Your cramps or spasms get more severe, more frequent, or do not improve over time.  MAKE SURE YOU:   Understand these instructions.  Will watch your condition.  Will get help right away if you are not doing well or get worse.   This information is not intended to replace advice given to you by your health care provider. Make sure you discuss any questions you have with your health care provider.   Document Released: 11/04/2001 Document Revised: 09/09/2012 Document Reviewed: 05/01/2012 Elsevier Interactive Patient Education 2016 Elsevier Inc.  Muscle Pain, Adult Muscle pain (myalgia) may be caused by many things, including:  Overuse or muscle strain, especially if you are not in shape. This is the most common cause of muscle pain.  Injury.  Bruises.  Viruses, such as the flu.  Infectious diseases.  Fibromyalgia, which is a chronic condition that causes muscle tenderness, fatigue, and headache.  Autoimmune diseases, including lupus.  Certain drugs, including ACE inhibitors and statins. Muscle pain may be mild or severe. In most cases, the pain lasts only a short time and goes away without treatment. To diagnose the cause of your muscle pain, your health care provider will take your medical history. This means he or she will ask you when your muscle pain began and what has been happening. If you have not had muscle pain for very long, your health care provider may want to wait before doing much testing. If your muscle pain has lasted a long time, your health care provider may want to run tests right away. If your health care provider thinks your muscle pain may be caused by illness, you may need to have additional tests to rule out certain conditions.  Treatment for muscle pain depends on the cause. Home care is often enough to relieve muscle pain. Your health care provider may also prescribe anti-inflammatory medicine. HOME CARE  INSTRUCTIONS Watch your condition for any changes. The following actions may help to lessen any discomfort you are feeling:  Only take over-the-counter or prescription medicines as directed by your health care provider.  Apply ice to the sore muscle:  Put ice in a plastic bag.  Place a towel between your skin and the bag.  Leave the ice on for 15-20 minutes, 3-4 times a day.  You may alternate applying hot and cold packs to the muscle as directed by your health care provider.  If overuse is causing your muscle pain, slow down your activities until the pain goes away.  Remember that it is normal to feel some muscle pain after starting a workout program. Muscles that have not been used often will be sore at first.  Do regular, gentle exercises if you are not usually active.  Warm up before exercising to lower your risk of muscle pain.  Do not continue working out if the pain is very bad. Bad pain could mean you have injured a muscle. SEEK MEDICAL CARE IF:  Your muscle pain gets worse, and medicines do not help.  You have muscle pain that lasts longer than 3 days.  You have a rash or fever along with muscle pain.  You have muscle pain after a tick bite.  You have muscle pain while working out, even though you are in good physical condition.  You have redness, soreness, or swelling along with muscle pain.  You have muscle pain after starting a new medicine or changing the dose of a medicine. SEEK IMMEDIATE MEDICAL CARE IF:  You have trouble breathing.  You have trouble swallowing.  You have muscle pain along with a stiff neck, fever, and vomiting.  You have severe muscle weakness or cannot move part of your body. MAKE SURE YOU:   Understand these instructions.  Will watch your condition.  Will get help right away if you are not doing well or get worse.   This information is not intended to replace advice given to you by your health care provider. Make sure you  discuss any questions you have with your health care provider.   Document Released: 04/06/2006 Document Revised: 06/05/2014 Document Reviewed: 03/11/2013 Elsevier Interactive Patient Education 2016 Elsevier Inc.  Thoracic Strain Thoracic strain is an injury to the muscles or tendons that attach to the upper back. A strain can be mild or severe. A mild strain may take only 1-2 weeks to heal. A severe strain involves torn muscles or tendons, so it may  take 6-8 weeks to heal. HOME CARE  Rest as needed. Limit your activity as told by your doctor.  If directed, put ice on the injured area:  Put ice in a plastic bag.  Place a towel between your skin and the bag.  Leave the ice on for 20 minutes, 2-3 times per day.  Take over-the-counter and prescription medicines only as told by your doctor.  Begin doing exercises as told by your doctor or physical therapist.  Warm up before being active.  Bend your knees before you lift heavy objects.  Keep all follow-up visits as told by your doctor. This is important. GET HELP IF:  Your pain is not helped by medicine.  Your pain, bruising, or swelling is getting worse.  You have a fever. GET HELP RIGHT AWAY IF:  You have shortness of breath.  You have chest pain.  You have weakness or loss of feeling (numbness) in your legs.  You cannot control when you pee (urinate).   This information is not intended to replace advice given to you by your health care provider. Make sure you discuss any questions you have with your health care provider.   Document Released: 11/01/2007 Document Revised: 02/03/2015 Document Reviewed: 07/09/2014 Elsevier Interactive Patient Education [employer]
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
## 2015-11-03 NOTE — Telephone Encounter (Signed)
Clld pt - [facility] re refill request on Oxycodone-acetaminophen 5-325 mg.
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
## 2019-03-27 ENCOUNTER — Ambulatory Visit: Payer: Self-pay

## 2019-04-03 ENCOUNTER — Ambulatory Visit (INDEPENDENT_AMBULATORY_CARE_PROVIDER_SITE_OTHER): Payer: Self-pay | Admitting: Internal Medicine

## 2019-04-03 ENCOUNTER — Other Ambulatory Visit: Payer: Self-pay

## 2019-04-03 ENCOUNTER — Encounter: Payer: Self-pay | Admitting: Internal Medicine

## 2019-04-03 DIAGNOSIS — J45909 Unspecified asthma, uncomplicated: Secondary | ICD-10-CM | POA: Insufficient documentation

## 2019-04-03 DIAGNOSIS — R569 Unspecified convulsions: Secondary | ICD-10-CM | POA: Insufficient documentation

## 2019-04-03 DIAGNOSIS — Z23 Encounter for immunization: Secondary | ICD-10-CM

## 2019-04-03 DIAGNOSIS — R519 Headache, unspecified: Secondary | ICD-10-CM

## 2019-04-03 LAB — POCT GLYCOSYLATED HEMOGLOBIN (HGB A1C): Hemoglobin A1C: 5.3 % (ref 4.0–5.6)

## 2019-04-03 LAB — GLUCOSE, CAPILLARY: Glucose-Capillary: 105 mg/dL — ABNORMAL HIGH (ref 70–99)

## 2019-04-03 NOTE — Addendum Note (Signed)
Addended by: Welford Roche on: 04/03/2019 04:06 PM   Modules accepted: Level of Service

## 2019-04-03 NOTE — Progress Notes (Signed)
   CC: Establish care and seizures  HPI:  Ms.Caitlin [name] is a 33 y.o. year-old female with PMH listed below who presents to clinic for establish care and seizures. Please see problem based assessment and plan for further details.    Past Medical History:  Diagnosis Date  . Asthma    Medications:  Ibuprofen PRN for HA  Albuterol inhaler PRN   Allergies: NKDA  PSH: Lap chole 2006, C-section x2   FH:  Father - epilepsy States her family does not talk about their medical problems very often   SH: Lives at home with boyfriend and 1 daughter (97) and 2 boys (66 and 8). 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 Has been unemployed since 04/2018. Drinks alcohol "every now and then", maybe one a week. Smokes cigarettes when feels anxious. Also smokes marijuana daily, but quit 3 weeks ago due to concern for seizures. No IV drug use.    Review of Systems:   Review of Systems  Constitutional: Negative for chills, fever, malaise/fatigue and weight loss.  Respiratory: Negative for cough and shortness of breath.   Cardiovascular: Negative for chest pain, palpitations, orthopnea, leg swelling and PND.  Gastrointestinal: Negative for abdominal pain, constipation, diarrhea, nausea and vomiting.  Musculoskeletal: Negative for back pain, falls and myalgias.  Neurological: Positive for seizures and headaches. Negative for dizziness, focal weakness and weakness.     Physical Exam:  Vitals:   04/03/19 1420  BP: 116/83  Pulse: 96  Temp: 98.4 F (36.9 C)  TempSrc: Oral  SpO2: 100%  Weight: 200 lb 6.4 oz (90.9 kg)  Height: 4\' 9"  (1.448 m)    General: young, pleasant female, well-appearing, in no acute distress  HENT: NCAT, neck supple and FROM, OP clear without exudates or erythema, nl dentition  Eyes: anicteric sclera, PERRL Cardiac: regular rate and rhythm, nl S1/S2, no murmurs, rubs or gallops  Pulm: CTAB, no wheezes or crackles, no increased work of  breathing on room air  Abd: soft, NTND, bowel sounds normoactive  Neuro: A&Ox3, CN II-XII intact, sensation intact in all four extremities, no motor deficits, nl finger-to-nose testing, nl heel-to shin  Ext: warm and well perfused, no peripheral edema   Assessment & Plan:   See Encounters Tab for problem based charting.  Patient discussed with Dr. Dareen Piano

## 2019-04-03 NOTE — Assessment & Plan Note (Addendum)
Patient reports experiencing seizures since 2019. She describes them as a sharp pain all over her head followed by rhythmic movements of LUE where she flexes her arm at the elbow and hits her chin multiple times followed by violent shaking and stiffening of her body. She has no memory of the events and reports her mother's and boyfriend's description of them. States she can also smell smoke and loses taste before these episode occurs occur. Episodes lasts about 1-2 minutes, seconds at times. They occur about 3-4x/day, sometimes >1 per day. Her last one was 2 days ago. She reports feeling confused for 30 minutes to 1 hour after the episodes. She has a family history of epilepsy in her father. She also reports marijuana use daily, which she stopped 3 weeks ago due to concern this was causing her seizures. Denies caffeine intake, but does drink sodas. No daily medications, just NSAID and albuterol PRN. Has recent stressors including losing her job in 04/2018, moving, losing savings, 3 kids with no paternal support, difficulty with current relationship. She also has difficulty sleeping and only sleeps 4-5 hours a night. Usually requires sleep aids to sleep. No neurological deficits on exam.   Suspect patient is having complex partial seizures that progresses to a generalized tonic clonic seizures. Will check blood work today (CBC, CMP, Mag, A1c, and TSH) and refer to neurology for further evaluation. Advise to abstain from driving (states she does not drive and does not have a driver's license) and marijuana cessation as it lowers seizure threshold. Advised to go to the ED for further evaluation next time she has seizure, multiple seizures in a day, or one that lasts > 2 minutes.

## 2019-04-03 NOTE — Patient Instructions (Addendum)
Ms. Hantz,   I am concerned the episodes you are having are in fact seizures.  The first thing we need to do is find out what is causing them which is why we did blood work today to check your electrolytes, your sugar, and your thyroid.  We will call you with the results.   The other important test that you will need is an EEG which is performed by a neurologist.  I placed a referral to neurology and they should be contacting you within 1 week to make an appointment.  If you do not hear from them in about 1 week please call the clinic and let us know so that we can arrange an appointment for you.  If you have a seizure that lasts more than 2-5 minutes go to the emergency department for evaluation.  Because you are having seizures it is important to you abstain from driving for the next 6 months.  - Dr. Frederico Hamman

## 2019-04-04 LAB — CBC WITH DIFFERENTIAL/PLATELET
Basophils Absolute: 0 10*3/uL (ref 0.0–0.2)
Basos: 1 %
EOS (ABSOLUTE): 0.4 10*3/uL (ref 0.0–0.4)
Eos: 6 %
Hematocrit: 37.7 % (ref 34.0–46.6)
Hemoglobin: 12.2 g/dL (ref 11.1–15.9)
Immature Grans (Abs): 0 10*3/uL (ref 0.0–0.1)
Immature Granulocytes: 0 %
Lymphocytes Absolute: 3.4 10*3/uL — ABNORMAL HIGH (ref 0.7–3.1)
Lymphs: 51 %
MCH: 26.3 pg — ABNORMAL LOW (ref 26.6–33.0)
MCHC: 32.4 g/dL (ref 31.5–35.7)
MCV: 81 fL (ref 79–97)
Monocytes Absolute: 0.4 10*3/uL (ref 0.1–0.9)
Monocytes: 5 %
Neutrophils Absolute: 2.5 10*3/uL (ref 1.4–7.0)
Neutrophils: 37 %
Platelets: 472 10*3/uL — ABNORMAL HIGH (ref 150–450)
RBC: 4.64 x10E6/uL (ref 3.77–5.28)
RDW: 14.6 % (ref 11.7–15.4)
WBC: 6.7 10*3/uL (ref 3.4–10.8)

## 2019-04-04 LAB — CMP14 + ANION GAP
ALT: 17 IU/L (ref 0–32)
AST: 15 IU/L (ref 0–40)
Albumin/Globulin Ratio: 1.5 (ref 1.2–2.2)
Albumin: 4.5 g/dL (ref 3.8–4.8)
Alkaline Phosphatase: 67 IU/L (ref 39–117)
Anion Gap: 17 mmol/L (ref 10.0–18.0)
BUN/Creatinine Ratio: 14 (ref 9–23)
BUN: 10 mg/dL (ref 6–20)
Bilirubin Total: 0.4 mg/dL (ref 0.0–1.2)
CO2: 20 mmol/L (ref 20–29)
Calcium: 9.7 mg/dL (ref 8.7–10.2)
Chloride: 103 mmol/L (ref 96–106)
Creatinine, Ser: 0.74 mg/dL (ref 0.57–1.00)
GFR calc Af Amer: 125 mL/min/{1.73_m2} (ref >59)
GFR calc non Af Amer: 108 mL/min/{1.73_m2} (ref >59)
Globulin, Total: 3 g/dL (ref 1.5–4.5)
Glucose: 89 mg/dL (ref 65–99)
Potassium: 4.6 mmol/L (ref 3.5–5.2)
Sodium: 140 mmol/L (ref 134–144)
Total Protein: 7.5 g/dL (ref 6.0–8.5)

## 2019-04-04 LAB — MAGNESIUM: Magnesium: 2 mg/dL (ref 1.6–2.3)

## 2019-04-04 LAB — TSH: TSH: 1.48 u[IU]/mL (ref 0.450–4.500)

## 2019-04-09 NOTE — Progress Notes (Signed)
Internal Medicine Clinic Attending  Case discussed with Dr. Santos-Sanchez at the time of the visit.  We reviewed the resident's history and exam and pertinent patient test results.  I agree with the assessment, diagnosis, and plan of care documented in the resident's note.    

## 2020-07-12 ENCOUNTER — Encounter (HOSPITAL_COMMUNITY): Payer: Self-pay

## 2020-07-12 ENCOUNTER — Other Ambulatory Visit: Payer: Self-pay

## 2020-07-12 ENCOUNTER — Ambulatory Visit (HOSPITAL_COMMUNITY)
Admission: EM | Admit: 2020-07-12 | Discharge: 2020-07-12 | Disposition: A | Discharge status: Home / Self Care | Payer: Self-pay | Attending: Family Medicine | Admitting: Family Medicine

## 2020-07-12 DIAGNOSIS — L03032 Cellulitis of left toe: Secondary | ICD-10-CM

## 2020-07-12 MED ORDER — DOXYCYCLINE HYCLATE 100 MG PO CAPS: 100.0000 mg | ORAL_CAPSULE | Freq: Two times a day (BID) | ORAL | 0 refills | Status: DC

## 2020-07-12 MED ORDER — HYDROCODONE-ACETAMINOPHEN 5-325 MG PO TABS: 1.0000 | ORAL_TABLET | Freq: Four times a day (QID) | ORAL | 0 refills | Status: DC | PRN

## 2020-07-12 NOTE — ED Triage Notes (Signed)
Pt presents with left side great toe pain since Saturday.

## 2020-07-12 NOTE — ED Notes (Signed)
Patient denies any known injury.  Patient removed band aid in treatment room.  Drainage on band aid, tissue around left great toenail is moist, white skin, and patient describes as extremely painful.  Patient reports she has had an ingrown toenail specific to this toe in the past

## 2020-07-12 NOTE — Discharge Instructions (Addendum)

## 2020-07-14 NOTE — ED Provider Notes (Signed)
[facility]   [id number] 07/12/20 Arrival Time: 1045  ASSESSMENT & PLAN:  1. Paronychia of great toe of left foot     Incision and Drainage Procedure Note  Anesthesia: PainEaze spray  Procedure Details  The procedure, risks and complications have been discussed in detail (including, but not limited to pain and bleeding) with the patient.  The skin induration was prepped and draped in the usual fashion. After adequate local anesthesia, I&D with a #11 blade was performed on the left lateral nail fold of great toe with purulent drainage.  EBL: minimal Drains: none Packing: n/a Condition: Tolerated procedure well Complications: none.  Meds ordered this encounter  Medications  . doxycycline (VIBRAMYCIN) 100 MG capsule    Sig: Take 1 capsule (100 mg total) by mouth 2 (two) times daily.    Dispense:  14 capsule    Refill:  0  . HYDROcodone-acetaminophen (NORCO/VICODIN) 5-325 MG tablet    Sig: Take 1 tablet by mouth every 6 (six) hours as needed for moderate pain or severe pain.    Dispense:  8 tablet    Refill:  0    Wound care instructions discussed and given in written format. To return in 48 hours for wound check if needed.  Finish all antibiotics. OTC analgesics as needed.  Contolled Substances Registry consulted for this patient. I feel the risk/benefit ratio today is favorable for proceeding with this prescription for a controlled substance. Medication sedation precautions given.  Reviewed expectations re: course of current medical issues. Questions answered. Outlined signs and symptoms indicating need for more acute intervention. Patient verbalized understanding. After Visit Summary given.   SUBJECTIVE:  Caitlin [name] is a 35 y.o. female who presents with a possible infection of her L great toe. Onset gradual, approximately 2 d ago; without active drainage and without active bleeding. Symptoms have progressed to a point and plateaued since beginning.  Fever: absent. OTC/home treatment: none.   OBJECTIVE:  Vitals:   07/12/20 1220 07/12/20 1233  BP: (!) 118/96 134/64  Pulse: 73 69  Resp: 17   Temp: 98.8 F (37.1 C)   TempSrc: Oral   SpO2: 99%      General appearance: alert; no distress L great toe: erythema and fluctuance of lateral nail fold of left great toe; very tender to touch; no active drainage or bleeding Psychological: alert and cooperative; normal mood and affect  No Known Allergies  Past Medical History:  Diagnosis Date  . Asthma   . Seizures (HCC)    Social History   Socioeconomic History  . Marital status: Single    Spouse name: Not on file  . Number of children: Not on file  . Years of education: Not on file  . Highest education level: Not on file  Occupational History  . Not on file  Tobacco Use  . Smoking status: Current Some Day Smoker    Packs/day: 0.25    Types: Cigarettes  . Smokeless tobacco: Not on file  . Tobacco comment: 2-3 per day   Substance and Sexual Activity  . Alcohol use: Yes    Comment: occasional  . Drug use: Yes    Types: Marijuana    Comment: Daily, stopped 02/2019  . Sexual activity: Yes    Partners: Male  Other Topics Concern  . Not on file  Social History Narrative  . Not on file   Social Determinants of Health   Financial Resource Strain: Not on file  Food Insecurity: Not on file  Transportation Needs: Not on file  Physical Activity: Not on file  Stress: Not on file  Social Connections: Not on file   Family History  Problem Relation Age of Onset  . Epilepsy Father    Past Surgical History:  Procedure Laterality Date  . CESAREAN SECTION    . CHOLECYSTECTOMY    . TUBAL LIGATION             Mardella Layman, MD 07/14/20 [phone]

## 2021-04-20 ENCOUNTER — Encounter (HOSPITAL_COMMUNITY): Payer: Self-pay

## 2021-04-20 ENCOUNTER — Other Ambulatory Visit: Payer: Self-pay

## 2021-04-20 ENCOUNTER — Ambulatory Visit (HOSPITAL_COMMUNITY)
Admission: EM | Admit: 2021-04-20 | Discharge: 2021-04-20 | Disposition: A | Discharge status: Home / Self Care | Payer: Self-pay | Attending: Family Medicine | Admitting: Family Medicine

## 2021-04-20 DIAGNOSIS — R0602 Shortness of breath: Secondary | ICD-10-CM

## 2021-04-20 DIAGNOSIS — J4521 Mild intermittent asthma with (acute) exacerbation: Secondary | ICD-10-CM

## 2021-04-20 DIAGNOSIS — J101 Influenza due to other identified influenza virus with other respiratory manifestations: Secondary | ICD-10-CM

## 2021-04-20 DIAGNOSIS — R051 Acute cough: Secondary | ICD-10-CM

## 2021-04-20 LAB — POC INFLUENZA A AND B ANTIGEN (URGENT CARE ONLY)
INFLUENZA A ANTIGEN, POC: POSITIVE — AB
INFLUENZA B ANTIGEN, POC: NEGATIVE

## 2021-04-20 MED ORDER — METHYLPREDNISOLONE SODIUM SUCC 125 MG IJ SOLR
INTRAMUSCULAR | Status: AC
  Filled 2021-04-20: qty 2

## 2021-04-20 MED ORDER — PROMETHAZINE-DM 6.25-15 MG/5ML PO SYRP: 5.0000 mL | ORAL_SOLUTION | Freq: Four times a day (QID) | ORAL | 0 refills | Status: DC | PRN

## 2021-04-20 MED ORDER — METHYLPREDNISOLONE SODIUM SUCC 125 MG IJ SOLR
125.0000 mg | Freq: Once | INTRAMUSCULAR | Status: AC
  Administered 2021-04-20: 125 mg via INTRAMUSCULAR

## 2021-04-20 MED ORDER — ALBUTEROL SULFATE HFA 108 (90 BASE) MCG/ACT IN AERS
4.0000 | INHALATION_SPRAY | Freq: Once | RESPIRATORY_TRACT | Status: AC
  Administered 2021-04-20: 4 via RESPIRATORY_TRACT

## 2021-04-20 MED ORDER — ALBUTEROL SULFATE HFA 108 (90 BASE) MCG/ACT IN AERS
INHALATION_SPRAY | RESPIRATORY_TRACT | Status: AC
  Filled 2021-04-20: qty 6.7

## 2021-04-20 MED ORDER — PREDNISONE 10 MG (21) PO TBPK: ORAL_TABLET | ORAL | 0 refills | Status: DC

## 2021-04-20 NOTE — Discharge Instructions (Signed)
You tested positive for influenza A.  You are outside the window where I think Tamiflu to be effective.  Please continue taking albuterol for shortness of breath.  Use prednisone as prescribed.  Do not take NSAIDs including aspirin, ibuprofen/Advil, naproxen/Aleve as it will cause stomach bleeding.  You can use Tylenol/acetaminophen.  Use Promethazine DM for cough.  This make you sleepy do not drive or drink alcohol taking it.  Make sure you rest and drink plenty of fluid.  If you have any worsening symptoms you need to be reevaluated.  You can return once you have been fever free for 24 hours without medication use and symptoms are improving.

## 2021-04-20 NOTE — ED Provider Notes (Signed)
[facility]    CSN: [id number] Arrival date & time: 04/20/21  1209      History   Chief Complaint Chief Complaint  Patient presents with   Cough    HPI Caitlin [name] is a 35 y.o. female.   Patient presents today with a 3 to 4-day history of URI symptoms including cough, congestion, fever, body aches, chills, nausea, vomiting.  She reports cough has become unbearable and she is now having shortness of breath and chest tightness.  She has been taking over-the-counter medication including NyQuil and multisymptom medication without improvement of symptoms.  She has tried her child's albuterol inhaler but has been ineffective.  She does report a remote history of asthma but has not required medication for many years.  She denies history of diabetes or any recent antibiotic or prednisone use.  She has had COVID-19 vaccination including booster but has not had influenza shot.  Reports difficulty with heel activities as result of symptoms.   Past Medical History:  Diagnosis Date   Asthma    Seizures [facility])     Patient Active Problem List   Diagnosis Date Noted   Seizures (HCC) 04/03/2019   Childhood asthma 04/03/2019    Past Surgical History:  Procedure Laterality Date   CESAREAN SECTION     CHOLECYSTECTOMY     TUBAL LIGATION      OB History   No obstetric history on file.      Home Medications    Prior to Admission medications   Medication Sig Start Date End Date Taking? Authorizing Provider  predniSONE (STERAPRED UNI-PAK 21 TAB) 10 MG (21) TBPK tablet As directed 04/20/21  Yes Atley Neubert K, PA-C  promethazine-dextromethorphan (PROMETHAZINE-DM) 6.25-15 MG/5ML syrup Take 5 mLs by mouth 4 (four) times daily as needed for cough. 04/20/21  Yes Lowell Mcgurk K, PA-C  gabapentin (NEURONTIN) 300 MG capsule 1 tab po at bedtime 1st day, 1 tablet bid second day, then 1 tablet tid 10/26/15   Hayden Rasmussen, NP  albuterol (PROVENTIL HFA;VENTOLIN HFA) 108 (90 BASE) MCG/ACT  inhaler Inhale 1 puff into the lungs every 4 (four) hours as needed. Shortness of breath  10/04/15  [provider]    Family History Family History  Problem Relation Age of Onset   Epilepsy Father     Social History Social History   Tobacco Use   Smoking status: Some Days    Packs/day: 0.25    Types: Cigarettes   Tobacco comments:    2-3 per day   Substance Use Topics   Alcohol use: Yes    Comment: occasional   Drug use: Yes    Types: Marijuana    Comment: Daily, stopped 02/2019     Allergies   Patient has no known allergies.   Review of Systems Review of Systems  Constitutional:  Positive for activity change and fatigue. Negative for appetite change and fever.  HENT:  Positive for congestion and sore throat. Negative for sinus pressure and sneezing.   Respiratory:  Positive for cough, chest tightness and shortness of breath.   Cardiovascular:  Negative for chest pain.  Gastrointestinal:  Positive for nausea and vomiting. Negative for abdominal pain and diarrhea.  Musculoskeletal:  Positive for arthralgias and myalgias.  Neurological:  Positive for headaches. Negative for dizziness and light-headedness.    Physical Exam Triage Vital Signs ED Triage Vitals [04/20/21 1412]  Enc Vitals Group     BP 134/83     Pulse Rate 67  Resp 18     Temp 98.3 F (36.8 C)     Temp Source Oral     SpO2 100 %     Weight      Height      Head Circumference      Peak Flow      Pain Score 8     Pain Loc      Pain Edu?      Excl. in GC?    No data found.  Updated Vital Signs BP 134/83 (BP Location: Right Arm)   Pulse 67   Temp 98.3 F (36.8 C) (Oral)   Resp 18   LMP 03/27/2021   SpO2 100%   Visual Acuity Right Eye Distance:   Left Eye Distance:   Bilateral Distance:    Right Eye Near:   Left Eye Near:    Bilateral Near:     Physical Exam Vitals reviewed.  Constitutional:      General: She is awake. She is not in acute distress.    Appearance:  Normal appearance. She is well-developed. She is not ill-appearing.     Comments: Very pleasant female appears stated age in no acute distress sitting comfortably in exam room  HENT:     Head: Normocephalic and atraumatic.     Right Ear: Tympanic membrane, ear canal and external ear normal. Tympanic membrane is not erythematous or bulging.     Left Ear: Tympanic membrane, ear canal and external ear normal. Tympanic membrane is not erythematous or bulging.     Nose:     Right Sinus: No maxillary sinus tenderness or frontal sinus tenderness.     Left Sinus: No maxillary sinus tenderness or frontal sinus tenderness.     Mouth/Throat:     Pharynx: Uvula midline. No oropharyngeal exudate or posterior oropharyngeal erythema.  Cardiovascular:     Rate and Rhythm: Normal rate and regular rhythm.     Heart sounds: Normal heart sounds, S1 normal and S2 normal. No murmur heard. Pulmonary:     Effort: Pulmonary effort is normal.     Breath sounds: Wheezing present. No rhonchi or rales.     Comments: Reactive cough with deep breathing.  Scattered wheezing throughout posterior lung fields Psychiatric:        Behavior: Behavior is cooperative.     UC Treatments / Results  Labs (all labs ordered are listed, but only abnormal results are displayed) Labs Reviewed  POC INFLUENZA A AND B ANTIGEN (URGENT CARE ONLY) - Abnormal; Notable for the following components:      Result Value   INFLUENZA A ANTIGEN, POC POSITIVE (*)    All other components within normal limits    EKG   Radiology No results found.  Procedures Procedures (including critical care time)  Medications Ordered in UC Medications  albuterol (VENTOLIN HFA) 108 (90 Base) MCG/ACT inhaler 4 puff (4 puffs Inhalation Given 04/20/21 1532)  methylPREDNISolone sodium succinate (SOLU-MEDROL) 125 mg/2 mL injection 125 mg (125 mg Intramuscular Given 04/20/21 1532)    Initial Impression / Assessment and Plan / UC Course  I have reviewed  the triage vital signs and the nursing notes.  Pertinent labs & imaging results that were available during my care of the patient were reviewed by me and considered in my medical decision making (see chart for details).     Patient tested positive for influenza A.  She is outside the window of effectiveness for Tamiflu so we will defer this.  She was  given 125 mg of Solu-Medrol and 4 puffs of albuterol with improvement of symptoms.  Believe that she has an exacerbation of asthma triggered by the flu given presentation.  We will start prednisone taper with instruction to take NSAIDs due to risk of GI bleeding.  She is to use albuterol Hailer every 4-6 hours as needed.  She was given Promethazine DM for cough.  Discussed that she is not to drive or drink alcohol while taking it.  Can use Tylenol, Mucinex, Flonase for additional symptom relief.  She is to rest and drink plenty of fluid.  Discussed alarm symptoms that warrant emergent evaluation.  Return precautions given to which she expressed understanding.  Final Clinical Impressions(s) / UC Diagnoses   Final diagnoses:  Influenza A  Acute cough  SOB (shortness of breath)  Mild intermittent asthma with acute exacerbation     Discharge Instructions      You tested positive for influenza A.  You are outside the window where I think Tamiflu to be effective.  Please continue taking albuterol for shortness of breath.  Use prednisone as prescribed.  Do not take NSAIDs including aspirin, ibuprofen/Advil, naproxen/Aleve as it will cause stomach bleeding.  You can use Tylenol/acetaminophen.  Use Promethazine DM for cough.  This make you sleepy do not drive or drink alcohol taking it.  Make sure you rest and drink plenty of fluid.  If you have any worsening symptoms you need to be reevaluated.  You can return once you have been fever free for 24 hours without medication use and symptoms are improving.     ED Prescriptions     Medication Sig Dispense  Auth. Provider   predniSONE (STERAPRED UNI-PAK 21 TAB) 10 MG (21) TBPK tablet As directed 21 tablet Navdeep Fessenden K, PA-C   promethazine-dextromethorphan (PROMETHAZINE-DM) 6.25-15 MG/5ML syrup Take 5 mLs by mouth 4 (four) times daily as needed for cough. 118 mL Jamayia Croker K, PA-C      PDMP not reviewed this encounter.   Jeani Hawking, PA-C 04/20/21 1550

## 2021-04-20 NOTE — ED Triage Notes (Signed)
Pt c/o cough, congestion, fever, body aches, chills, nausea,vomiting, and chest/back pain when coughing.

## 2021-08-03 ENCOUNTER — Other Ambulatory Visit: Payer: Self-pay

## 2021-08-03 ENCOUNTER — Ambulatory Visit (HOSPITAL_COMMUNITY)
Admission: EM | Admit: 2021-08-03 | Discharge: 2021-08-03 | Disposition: A | Discharge status: Home / Self Care | Payer: Self-pay | Attending: Family Medicine | Admitting: Family Medicine

## 2021-08-03 ENCOUNTER — Ambulatory Visit (INDEPENDENT_AMBULATORY_CARE_PROVIDER_SITE_OTHER): Payer: Self-pay

## 2021-08-03 DIAGNOSIS — S93601A Unspecified sprain of right foot, initial encounter: Secondary | ICD-10-CM

## 2021-08-03 DIAGNOSIS — M79671 Pain in right foot: Secondary | ICD-10-CM

## 2021-08-03 NOTE — Discharge Instructions (Signed)
You were seen today for foot pain.  Your xray was normal.  No fracture is seen.  Based on your symptoms this appears to be a sprain/over use of the foot.  ?I do recommend some time off of your foot.  I recommend ice, rest, and elevation.  I recommend getting shoes with a good support.   ?If you continue with pain you may wish to follow up with a podiatrist/orthopedist. You may call [facility] at  [phone] ?

## 2021-08-03 NOTE — ED Triage Notes (Signed)
Pt presents for right foot and ankle pain for several weeks, no known injury to report. ?

## 2021-08-03 NOTE — ED Provider Notes (Signed)
?[facility] ? ? ? ?CSN: [id number] ?Arrival date & time: 08/03/21  1327 ? ? ?  ? ?History   ?Chief Complaint ?Chief Complaint  ?Patient presents with  ? Foot Pain  ? ? ?HPI ?Caitlin [name] is a 36 y.o. female.  ? ?Patient is here for right foot pain over a month.  Lateral foot, along the 5th metatarsal.  No known injury.    2 months ago a loaded dolly did go over the foot, but doesn't think that caused it.  ?Slight swelling and redness;  ?She has been soaking it.  ?She is on her feet for work for 8 hrs;  being on her foot makes it worse.  ?She has little to no pain in the morning.  She has the worst pain after being on her feet all day.  ? ?Past Medical History:  ?Diagnosis Date  ? Asthma   ? Seizures (HCC)   ? ? ?Patient Active Problem List  ? Diagnosis Date Noted  ? Seizures (HCC) 04/03/2019  ? Childhood asthma 04/03/2019  ? ? ?Past Surgical History:  ?Procedure Laterality Date  ? CESAREAN SECTION    ? CHOLECYSTECTOMY    ? TUBAL LIGATION    ? ? ?OB History   ?No obstetric history on file. ?  ? ? ? ?Home Medications   ? ?Prior to Admission medications   ?Medication Sig Start Date End Date Taking? Authorizing Provider  ?gabapentin (NEURONTIN) 300 MG capsule 1 tab po at bedtime 1st day, 1 tablet bid second day, then 1 tablet tid 10/26/15   Hayden Rasmussen, NP  ?predniSONE (STERAPRED UNI-PAK 21 TAB) 10 MG (21) TBPK tablet As directed 04/20/21   Raspet, Heath Tesler K, PA-C  ?promethazine-dextromethorphan (PROMETHAZINE-DM) 6.25-15 MG/5ML syrup Take 5 mLs by mouth 4 (four) times daily as needed for cough. 04/20/21   Raspet, Noberto Retort, PA-C  ?albuterol (PROVENTIL HFA;VENTOLIN HFA) 108 (90 BASE) MCG/ACT inhaler Inhale 1 puff into the lungs every 4 (four) hours as needed. Shortness of breath  10/04/15  [provider]  ? ? ?Family History ?Family History  ?Problem Relation Age of Onset  ? Epilepsy Father   ? ? ?Social History ?Social History  ? ?Tobacco Use  ? Smoking status: Some Days  ?  Packs/day: 0.25  ?  Types:  Cigarettes  ? Tobacco comments:  ?  2-3 per day   ?Substance Use Topics  ? Alcohol use: Yes  ?  Comment: occasional  ? Drug use: Yes  ?  Types: Marijuana  ?  Comment: Daily, stopped 02/2019  ? ? ? ?Allergies   ?Patient has no known allergies. ? ? ?Review of Systems ?Review of Systems  ?Constitutional: Negative.   ?HENT: Negative.    ?Respiratory: Negative.    ?Cardiovascular: Negative.   ?Gastrointestinal: Negative.   ?Musculoskeletal:  Positive for gait problem.  ? ? ?Physical Exam ?Triage Vital Signs ?ED Triage Vitals [08/03/21 1411]  ?Enc Vitals Group  ?   BP 123/76  ?   Pulse Rate 79  ?   Resp 16  ?   Temp 97.6 ?F (36.4 ?C)  ?   Temp Source Oral  ?   SpO2 100 %  ?   Weight   ?   Height   ?   Head Circumference   ?   Peak Flow   ?   Pain Score   ?   Pain Loc   ?   Pain Edu?   ?   Excl. in  GC?   ? ?No data found. ? ?Updated Vital Signs ?BP 123/76 (BP Location: Left Arm)   Pulse 79   Temp 97.6 ?F (36.4 ?C) (Oral)   Resp 16   LMP 07/03/2021 (Approximate)   SpO2 100%  ? ?Visual Acuity ?Right Eye Distance:   ?Left Eye Distance:   ?Bilateral Distance:   ? ?Right Eye Near:   ?Left Eye Near:    ?Bilateral Near:    ? ?Physical Exam ?Constitutional:   ?   Appearance: Normal appearance.  ?Musculoskeletal:  ?   Comments: Swelling to the lateral right foot noted;  no TTP to the ankle; no TTP at the 1st/2nd metatarsal; TTP at the 3rd-5th metatarsal;  limited rom due to pain  ?Neurological:  ?   Mental Status: She is alert.  ? ? ? ?UC Treatments / Results  ?Labs ?(all labs ordered are listed, but only abnormal results are displayed) ?Labs Reviewed - No data to display ? ?EKG ? ? ?Radiology ?DG Foot Complete Right ? ?Result Date: 08/03/2021 ?CLINICAL DATA:  Pain and swelling EXAM: RIGHT FOOT COMPLETE - 3+ VIEW COMPARISON:  09/27/2015 FINDINGS: No fracture or dislocation is seen. There are no focal lytic lesions. There are no opaque foreign bodies. Plantar spur is seen in calcaneus. IMPRESSION: No fracture or dislocation is  seen in the right foot. Plantar spur is seen in calcaneus. Electronically Signed   By: Ernie Avena M.D.   On: 08/03/2021 14:45   ? ?Procedures ?Procedures (including critical care time) ? ?Medications Ordered in UC ?Medications - No data to display ? ?Initial Impression / Assessment and Plan / UC Course  ?I have reviewed the triage vital signs and the nursing notes. ? ?Pertinent labs & imaging results that were available during my care of the patient were reviewed by me and considered in my medical decision making (see chart for details). ? ?  ?Final Clinical Impressions(s) / UC Diagnoses  ? ?Final diagnoses:  ?Right foot pain  ?Sprain of right foot, initial encounter  ? ? ? ?Discharge Instructions   ? ?  ?You were seen today for foot pain.  Your xray was normal.  No fracture is seen.  Based on your symptoms this appears to be a sprain/over use of the foot.  ?I do recommend some time off of your foot.  I recommend ice, rest, and elevation.  I recommend getting shoes with a good support.   ?If you continue with pain you may wish to follow up with a podiatrist/orthopedist. You may call [facility] at  [phone] ? ? ? ?ED Prescriptions   ?None ?  ? ?PDMP not reviewed this encounter. ?  Jannifer Franklin, MD ?08/03/21 1504 ? ?

## 2021-09-08 ENCOUNTER — Ambulatory Visit (HOSPITAL_COMMUNITY)
Admission: EM | Admit: 2021-09-08 | Discharge: 2021-09-08 | Disposition: A | Discharge status: Home / Self Care | Payer: Self-pay | Attending: Emergency Medicine | Admitting: Emergency Medicine

## 2021-09-08 ENCOUNTER — Encounter (HOSPITAL_COMMUNITY): Payer: Self-pay

## 2021-09-08 DIAGNOSIS — R112 Nausea with vomiting, unspecified: Secondary | ICD-10-CM

## 2021-09-08 DIAGNOSIS — R197 Diarrhea, unspecified: Secondary | ICD-10-CM

## 2021-09-08 DIAGNOSIS — R1013 Epigastric pain: Secondary | ICD-10-CM

## 2021-09-08 DIAGNOSIS — R1115 Cyclical vomiting syndrome unrelated to migraine: Secondary | ICD-10-CM

## 2021-09-08 LAB — POC URINE PREG, ED: Preg Test, Ur: NEGATIVE

## 2021-09-08 MED ORDER — LIDOCAINE VISCOUS HCL 2 % MT SOLN
15.0000 mL | Freq: Once | OROMUCOSAL | Status: AC
  Administered 2021-09-08: 15 mL via ORAL

## 2021-09-08 MED ORDER — ONDANSETRON 4 MG PO TBDP
ORAL_TABLET | ORAL | Status: AC
  Filled 2021-09-08: qty 1

## 2021-09-08 MED ORDER — ONDANSETRON 4 MG PO TBDP
4.0000 mg | ORAL_TABLET | Freq: Once | ORAL | Status: AC
  Administered 2021-09-08: 4 mg via ORAL

## 2021-09-08 MED ORDER — METOCLOPRAMIDE HCL 10 MG PO TABS: 10.0000 mg | ORAL_TABLET | Freq: Four times a day (QID) | ORAL | 0 refills | Status: DC

## 2021-09-08 MED ORDER — LIDOCAINE VISCOUS HCL 2 % MT SOLN
OROMUCOSAL | Status: AC
  Filled 2021-09-08: qty 15

## 2021-09-08 MED ORDER — ALUM & MAG HYDROXIDE-SIMETH 200-200-20 MG/5ML PO SUSP
ORAL | Status: AC
  Filled 2021-09-08: qty 30

## 2021-09-08 MED ORDER — FAMOTIDINE 20 MG PO TABS: 20.0000 mg | ORAL_TABLET | Freq: Two times a day (BID) | ORAL | 0 refills | Status: DC

## 2021-09-08 MED ORDER — DICYCLOMINE HCL 10 MG/5ML PO SOLN: 10.0000 mg | Freq: Once | ORAL | Status: DC

## 2021-09-08 MED ORDER — ALUM & MAG HYDROXIDE-SIMETH 200-200-20 MG/5ML PO SUSP
30.0000 mL | Freq: Once | ORAL | Status: AC
  Administered 2021-09-08: 30 mL via ORAL

## 2021-09-08 NOTE — Discharge Instructions (Addendum)
You are likely suffering from intractable vomiting from daily marijuana use.  Use antinausea as directed.  You should go to emergency room today to have stat labs checked as I could not rule out an acute pancreatitis or other abnormality.  Take antinausea and antacid tablet as directed.  Stop smoking marijuana ?

## 2021-09-08 NOTE — ED Triage Notes (Signed)
Pt presents with c/o vomiting and mid abdominal pain. Pt states when she drinks water or eats something she gets a harsh cramping feeling in her stomach.  ?

## 2021-09-08 NOTE — ED Provider Notes (Addendum)
?[name] ? ? ?MRN: [id number] DOB: 1986/01/01 ? ?Subjective:  ? ?Chief Complaint;  ?Chief Complaint  ?Patient presents with  ? Abdominal Pain  ? Emesis  ? Nausea  ? ?Pt presents with c/o vomiting and mid abdominal pain. Pt states when she drinks water or eats something she gets a harsh cramping feeling in her stomach ? ?Caitlin [name] is a 36 y.o. female presenting for vomiting and stomach cramping for the last 2 days.  She states she has been having intermittent symptoms for the last month.  She attributes her symptoms to lack of breaks at work and poor eating habits.  She is using marijuana daily for her anxiety and depression.  She denies dysuria or fevers. ? ? ?Current Facility-Administered Medications:  ?  alum & mag hydroxide-simeth (MAALOX/MYLANTA) 200-200-20 MG/5ML suspension 30 mL, 30 mL, Oral, Once **AND** lidocaine (XYLOCAINE) 2 % viscous mouth solution 15 mL, 15 mL, Oral, Once, Dewaine Conger L, NP ?  dicyclomine (BENTYL) 10 MG/5ML solution 10 mg, 10 mg, Oral, Once, Dewaine Conger L, NP ?  ondansetron (ZOFRAN-ODT) disintegrating tablet 4 mg, 4 mg, Oral, Once, Jone Baseman, NP ? ?Current Outpatient Medications:  ?  famotidine (PEPCID) 20 MG tablet, Take 1 tablet (20 mg total) by mouth 2 (two) times daily., Disp: 30 tablet, Rfl: 0 ?  metoCLOPramide (REGLAN) 10 MG tablet, Take 1 tablet (10 mg total) by mouth every 6 (six) hours., Disp: 30 tablet, Rfl: 0 ?  gabapentin (NEURONTIN) 300 MG capsule, 1 tab po at bedtime 1st day, 1 tablet bid second day, then 1 tablet tid, Disp: 30 capsule, Rfl: 0  ? ?No Known Allergies ? ?Past Medical History:  ?Diagnosis Date  ? Asthma   ? Seizures (HCC)   ?  ? ?Review of Systems  ?All other systems reviewed and are negative. ? ? ?Objective:  ? ?Vitals: ?BP 107/89 (BP Location: Left Arm)   Pulse 69   Temp 98.9 ?F (37.2 ?C)   Resp 17   LMP 09/07/2021 (Approximate)   SpO2 100%  ? ?Physical Exam ?Vitals and nursing note reviewed.  ?Constitutional:   ?    General: She is in acute distress.  ?   Comments: APPEARS UNCOMFORTABLE  ?Abdominal:  ?   Tenderness: There is abdominal tenderness (diffuse direct ttp- no rebound or distention upper abdomen).  ? ? ?Results for orders placed or performed during the hospital encounter of 09/08/21 (from the past 24 hour(s))  ?POC urine pregnancy     Status: None  ? Collection Time: 09/08/21 12:29 PM  ?Result Value Ref Range  ? Preg Test, Ur NEGATIVE NEGATIVE  ? ? ?No results found.  ?  ? ?Assessment and Plan :  ? ?1. Nausea vomiting and diarrhea   ?2. Epigastric pain   ?3. Intractable cyclical vomiting   ? ? ?Meds ordered this encounter  ?Medications  ? dicyclomine (BENTYL) 10 MG/5ML solution 10 mg  ? AND Linked Order Group  ?  alum & mag hydroxide-simeth (MAALOX/MYLANTA) 200-200-20 MG/5ML suspension 30 mL  ?  lidocaine (XYLOCAINE) 2 % viscous mouth solution 15 mL  ? ondansetron (ZOFRAN-ODT) disintegrating tablet 4 mg  ? metoCLOPramide (REGLAN) 10 MG tablet  ?  Sig: Take 1 tablet (10 mg total) by mouth every 6 (six) hours.  ?  Dispense:  30 tablet  ?  Refill:  0  ? famotidine (PEPCID) 20 MG tablet  ?  Sig: Take 1 tablet (20 mg total) by mouth 2 (  two) times daily.  ?  Dispense:  30 tablet  ?  Refill:  0  ? ? ?MDM:  ?Caitlin [name] is a 36 y.o. female presenting for upper abdominal cramping intermittently worsening over the last month.  She has associated nausea and vomiting which is gotten progressively worse over the last couple of days she is a daily marijuana smoker.  Her physical exam is normal except for the direct tenderness mid upper abdomen.  Given her history of daily marijuana I I would suspect a cyclic vomiting syndrome but could not rule out other pathology such as pancreatitis.  She is encouraged to go to the emergency room for further diagnostics including lab work.  She was given a GI cocktail and antinausea INR clinic.  I discussed treatment, follow up and return instructions. Questions were answered. Patient stated  understanding of instructions and is stable for discharge. ? ?Dewaine Conger FNP-C MCN  ?  ?Jone Baseman, NP ?09/08/21 1257 ? ?  ?Jone Baseman, NP ?09/08/21 1259 ? ?

## 2022-03-28 ENCOUNTER — Emergency Department (HOSPITAL_COMMUNITY)
Admission: EM | Admit: 2022-03-28 | Discharge: 2022-03-28 | Disposition: A | Discharge status: Other Acute Inpatient Hospital | Payer: Federal, State, Local not specified - Other | Attending: Student | Admitting: Student

## 2022-03-28 ENCOUNTER — Encounter (HOSPITAL_COMMUNITY): Payer: Self-pay | Admitting: Psychiatry

## 2022-03-28 ENCOUNTER — Other Ambulatory Visit: Payer: Self-pay

## 2022-03-28 ENCOUNTER — Inpatient Hospital Stay (HOSPITAL_COMMUNITY)
Admission: AD | Admit: 2022-03-28 | Discharge: 2022-04-01 | DRG: 885 | Disposition: A | Discharge status: Home / Self Care | Payer: Federal, State, Local not specified - Other | Source: Intra-hospital | Attending: Psychiatry | Admitting: Psychiatry

## 2022-03-28 ENCOUNTER — Encounter (HOSPITAL_COMMUNITY): Payer: Self-pay

## 2022-03-28 DIAGNOSIS — Z23 Encounter for immunization: Secondary | ICD-10-CM | POA: Diagnosis not present

## 2022-03-28 DIAGNOSIS — Z91411 Personal history of adult psychological abuse: Secondary | ICD-10-CM

## 2022-03-28 DIAGNOSIS — Z1152 Encounter for screening for COVID-19: Secondary | ICD-10-CM | POA: Insufficient documentation

## 2022-03-28 DIAGNOSIS — G4700 Insomnia, unspecified: Secondary | ICD-10-CM | POA: Diagnosis present

## 2022-03-28 DIAGNOSIS — F411 Generalized anxiety disorder: Secondary | ICD-10-CM | POA: Diagnosis present

## 2022-03-28 DIAGNOSIS — Z79899 Other long term (current) drug therapy: Secondary | ICD-10-CM | POA: Diagnosis not present

## 2022-03-28 DIAGNOSIS — F17210 Nicotine dependence, cigarettes, uncomplicated: Secondary | ICD-10-CM | POA: Diagnosis present

## 2022-03-28 DIAGNOSIS — F410 Panic disorder [episodic paroxysmal anxiety] without agoraphobia: Secondary | ICD-10-CM | POA: Diagnosis present

## 2022-03-28 DIAGNOSIS — F322 Major depressive disorder, single episode, severe without psychotic features: Secondary | ICD-10-CM | POA: Diagnosis present

## 2022-03-28 DIAGNOSIS — R064 Hyperventilation: Secondary | ICD-10-CM | POA: Diagnosis present

## 2022-03-28 DIAGNOSIS — F1220 Cannabis dependence, uncomplicated: Secondary | ICD-10-CM | POA: Diagnosis present

## 2022-03-28 DIAGNOSIS — F4310 Post-traumatic stress disorder, unspecified: Secondary | ICD-10-CM | POA: Diagnosis present

## 2022-03-28 DIAGNOSIS — N390 Urinary tract infection, site not specified: Secondary | ICD-10-CM | POA: Diagnosis present

## 2022-03-28 DIAGNOSIS — Z9151 Personal history of suicidal behavior: Secondary | ICD-10-CM

## 2022-03-28 DIAGNOSIS — R45851 Suicidal ideations: Secondary | ICD-10-CM

## 2022-03-28 DIAGNOSIS — Z7289 Other problems related to lifestyle: Secondary | ICD-10-CM | POA: Insufficient documentation

## 2022-03-28 DIAGNOSIS — Z91410 Personal history of adult physical and sexual abuse: Secondary | ICD-10-CM | POA: Diagnosis not present

## 2022-03-28 DIAGNOSIS — J45909 Unspecified asthma, uncomplicated: Secondary | ICD-10-CM | POA: Diagnosis present

## 2022-03-28 DIAGNOSIS — Z046 Encounter for general psychiatric examination, requested by authority: Secondary | ICD-10-CM | POA: Insufficient documentation

## 2022-03-28 DIAGNOSIS — Z87820 Personal history of traumatic brain injury: Secondary | ICD-10-CM

## 2022-03-28 DIAGNOSIS — E559 Vitamin D deficiency, unspecified: Secondary | ICD-10-CM | POA: Diagnosis present

## 2022-03-28 DIAGNOSIS — F329 Major depressive disorder, single episode, unspecified: Secondary | ICD-10-CM | POA: Diagnosis present

## 2022-03-28 DIAGNOSIS — R630 Anorexia: Secondary | ICD-10-CM | POA: Diagnosis present

## 2022-03-28 DIAGNOSIS — R569 Unspecified convulsions: Secondary | ICD-10-CM

## 2022-03-28 LAB — RAPID URINE DRUG SCREEN, HOSP PERFORMED
Amphetamines: NOT DETECTED
Barbiturates: NOT DETECTED
Benzodiazepines: NOT DETECTED
Cocaine: NOT DETECTED
Opiates: NOT DETECTED
Tetrahydrocannabinol: POSITIVE — AB

## 2022-03-28 LAB — CBC WITH DIFFERENTIAL/PLATELET
Abs Immature Granulocytes: 0.02 10*3/uL (ref 0.00–0.07)
Basophils Absolute: 0 10*3/uL (ref 0.0–0.1)
Basophils Relative: 0 %
Eosinophils Absolute: 0.2 10*3/uL (ref 0.0–0.5)
Eosinophils Relative: 3 %
HCT: 35.7 % — ABNORMAL LOW (ref 36.0–46.0)
Hemoglobin: 11 g/dL — ABNORMAL LOW (ref 12.0–15.0)
Immature Granulocytes: 0 %
Lymphocytes Relative: 30 %
Lymphs Abs: 2.3 10*3/uL (ref 0.7–4.0)
MCH: 26.2 pg (ref 26.0–34.0)
MCHC: 30.8 g/dL (ref 30.0–36.0)
MCV: 85 fL (ref 80.0–100.0)
Monocytes Absolute: 0.5 10*3/uL (ref 0.1–1.0)
Monocytes Relative: 7 %
Neutro Abs: 4.6 10*3/uL (ref 1.7–7.7)
Neutrophils Relative %: 60 %
Platelets: 376 10*3/uL (ref 150–400)
RBC: 4.2 MIL/uL (ref 3.87–5.11)
RDW: 13.7 % (ref 11.5–15.5)
WBC: 7.6 10*3/uL (ref 4.0–10.5)
nRBC: 0 % (ref 0.0–0.2)

## 2022-03-28 LAB — RESP PANEL BY RT-PCR (FLU A&B, COVID) ARPGX2
Influenza A by PCR: NEGATIVE
Influenza B by PCR: NEGATIVE
SARS Coronavirus 2 by RT PCR: NEGATIVE

## 2022-03-28 LAB — COMPREHENSIVE METABOLIC PANEL
ALT: 18 U/L (ref 0–44)
AST: 18 U/L (ref 15–41)
Albumin: 3.8 g/dL (ref 3.5–5.0)
Alkaline Phosphatase: 41 U/L (ref 38–126)
Anion gap: 10 (ref 5–15)
BUN: 7 mg/dL (ref 6–20)
CO2: 22 mmol/L (ref 22–32)
Calcium: 9 mg/dL (ref 8.9–10.3)
Chloride: 105 mmol/L (ref 98–111)
Creatinine, Ser: 0.82 mg/dL (ref 0.44–1.00)
GFR, Estimated: >60 mL/min (ref >60)
Glucose, Bld: 102 mg/dL — ABNORMAL HIGH (ref 70–99)
Potassium: 3.5 mmol/L (ref 3.5–5.1)
Sodium: 137 mmol/L (ref 135–145)
Total Bilirubin: 0.5 mg/dL (ref 0.3–1.2)
Total Protein: 6.7 g/dL (ref 6.5–8.1)

## 2022-03-28 LAB — ETHANOL: Alcohol, Ethyl (B): <10 mg/dL (ref <10)

## 2022-03-28 LAB — ACETAMINOPHEN LEVEL: Acetaminophen (Tylenol), Serum: <10 ug/mL — ABNORMAL LOW (ref 10–30)

## 2022-03-28 LAB — I-STAT BETA HCG BLOOD, ED (MC, WL, AP ONLY): I-stat hCG, quantitative: <5 m[IU]/mL (ref <5)

## 2022-03-28 LAB — SALICYLATE LEVEL: Salicylate Lvl: <7 mg/dL — ABNORMAL LOW (ref 7.0–30.0)

## 2022-03-28 MED ORDER — ALUM & MAG HYDROXIDE-SIMETH 200-200-20 MG/5ML PO SUSP: 30.0000 mL | ORAL | Status: DC | PRN

## 2022-03-28 MED ORDER — LORAZEPAM 1 MG PO TABS
1.0000 mg | ORAL_TABLET | ORAL | Status: AC | PRN
  Administered 2022-03-29: 1 mg via ORAL
  Filled 2022-03-28: qty 1

## 2022-03-28 MED ORDER — ACETAMINOPHEN 325 MG PO TABS
650.0000 mg | ORAL_TABLET | Freq: Four times a day (QID) | ORAL | Status: DC | PRN
  Administered 2022-03-28: 650 mg via ORAL
  Filled 2022-03-28: qty 2

## 2022-03-28 MED ORDER — TRAZODONE HCL 50 MG PO TABS
50.0000 mg | ORAL_TABLET | Freq: Every evening | ORAL | Status: DC | PRN
  Administered 2022-03-28: 50 mg via ORAL
  Filled 2022-03-28: qty 7
  Filled 2022-03-28: qty 1

## 2022-03-28 MED ORDER — LORAZEPAM 1 MG PO TABS: 1.0000 mg | ORAL_TABLET | Freq: Four times a day (QID) | ORAL | Status: DC | PRN

## 2022-03-28 MED ORDER — ZIPRASIDONE MESYLATE 20 MG IM SOLR: 20.0000 mg | Freq: Two times a day (BID) | INTRAMUSCULAR | Status: DC | PRN

## 2022-03-28 MED ORDER — LORAZEPAM 2 MG/ML IJ SOLN: 1.0000 mg | Freq: Four times a day (QID) | INTRAMUSCULAR | Status: DC | PRN

## 2022-03-28 MED ORDER — MAGNESIUM HYDROXIDE 400 MG/5ML PO SUSP: 30.0000 mL | Freq: Every day | ORAL | Status: DC | PRN

## 2022-03-28 NOTE — ED Notes (Signed)
Molli Barrows (Son) of Deneise Lever called asking for her to call him at [phone].

## 2022-03-28 NOTE — ED Notes (Signed)
Pt signed voluntary paperwork
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
## 2022-03-28 NOTE — ED Notes (Signed)
IVC paperwork is done. The IVC will exp: 04/04/22

## 2022-03-28 NOTE — Tx Team (Signed)
Initial Treatment Plan 03/28/2022 11:04 PM Zeriah A Slyter [id number]    PATIENT STRESSORS: Financial difficulties   Marital or family conflict   Medication change or noncompliance   Occupational concerns     PATIENT STRENGTHS: Ability for insight  Marketing executive fund of knowledge  Motivation for treatment/growth  Work skills    PATIENT IDENTIFIED PROBLEMS: Risk for SI  anxiety  depression  Coping skills  "Nothing now"             DISCHARGE CRITERIA:  Improved stabilization in mood, thinking, and/or behavior Motivation to continue treatment in a less acute level of care Verbal commitment to aftercare and medication compliance  PRELIMINARY DISCHARGE PLAN: Attend aftercare/continuing care group Attend PHP/IOP Outpatient therapy  PATIENT/FAMILY INVOLVEMENT: This treatment plan has been presented to and reviewed with the patient, Dauna A Chabot.  The patient and family have been given the opportunity to ask questions and make suggestions.  Providence Crosby, RN 03/28/2022, 11:04 PM
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
## 2022-03-28 NOTE — Progress Notes (Signed)
Adult Psychoeducational Group Note  Date:  03/28/2022 Time:  9:09 PM  Group Topic/Focus:  Wrap-Up Group:   The focus of this group is to help patients review their daily goal of treatment and discuss progress on daily workbooks.  Participation Level:  Did Not Attend  Participation Quality:   n/a  Affect:   n/a  Cognitive:   n/a  Insight: None  Engagement in Group:   n/a  Modes of Intervention:   n/a  Additional Comments:   Pt did not attend the Wrap Up group.  Wetzel Bjornstad Afton Mikelson 03/28/2022, 9:09 PM

## 2022-03-28 NOTE — ED Notes (Signed)
IVC paperwork is in yellow zone purple side

## 2022-03-28 NOTE — ED Notes (Signed)
Pt states she wants phone in bag and  not security

## 2022-03-28 NOTE — ED Provider Notes (Signed)
[facility]-EMERGENCY DEPT [facility] Emergency Department Provider Note MRN:  [id number]  Arrival date & time: 03/28/22     Chief Complaint   SI  History of Present Illness   Caitlin [name] is a 36 y.o. year-old female presents to the ED with chief complaint of SI.  States that she cut her wrist with intent to kill herself.  States that she cut herself with a box cutter.  Denies ETOH or drug use.   History provided by patient.   Review of Systems  Pertinent positive and negative review of systems noted in HPI.    Physical Exam   Vitals:   03/28/22 0407 03/28/22 0410  BP:  127/79  Pulse:  73  Resp:  17  Temp:  98.5 F (36.9 C)  SpO2: 99% 99%    CONSTITUTIONAL:  tearful-appearing, NAD NEURO:  Alert and oriented x 3, CN 3-12 grossly intact EYES:  eyes equal and reactive ENT/NECK:  Supple, no stridor  CARDIAC: appears well-perfused  PULM:  No respiratory distress,  GI/GU:  non-distended,  MSK/SPINE:  No gross deformities, no edema, moves all extremities  SKIN:  no rash, atraumatic, minor 2 cm laceration to left anterior wrist   *Additional and/or pertinent findings included in MDM below  Diagnostic and Interventional Summary     Labs Reviewed  COMPREHENSIVE METABOLIC PANEL - Abnormal; Notable for the following components:      Result Value   Glucose, Bld 102 (*)    All other components within normal limits  SALICYLATE LEVEL - Abnormal; Notable for the following components:   Salicylate Lvl <7.0 (*)    All other components within normal limits  ACETAMINOPHEN LEVEL - Abnormal; Notable for the following components:   Acetaminophen (Tylenol), Serum <10 (*)    All other components within normal limits  RAPID URINE DRUG SCREEN, HOSP PERFORMED - Abnormal; Notable for the following components:   Tetrahydrocannabinol POSITIVE (*)    All other components within normal limits  CBC WITH DIFFERENTIAL/PLATELET - Abnormal; Notable for the following components:   Hemoglobin  11.0 (*)    HCT 35.7 (*)    All other components within normal limits  RESP PANEL BY RT-PCR (FLU A&B, COVID) ARPGX2  ETHANOL  I-STAT BETA HCG BLOOD, ED ([facility], [facility], [facility] ONLY)  CBG MONITORING, ED    No orders to display    Medications - No data to display   Procedures  /  Critical Care .Marland KitchenLaceration Repair  Date/Time: 03/28/2022 5:38 AM  Performed by: Roxy Horseman, PA-C Authorized by: Roxy Horseman, PA-C   Consent:    Consent obtained:  Verbal   Consent given by:  Patient   Risks discussed:  Pain   Alternatives discussed:  No treatment Universal protocol:    Procedure explained and questions answered to patient or proxy's satisfaction: yes     Relevant documents present and verified: yes     Test results available: yes     Imaging studies available: yes     Required blood products, implants, devices, and special equipment available: yes     Site/side marked: yes     Immediately prior to procedure, a time out was called: yes     Patient identity confirmed:  Verbally with patient Anesthesia:    Anesthesia method:  None Laceration details:    Location:  Shoulder/arm   Shoulder/arm location:  L lower arm   Length (cm):  3 Pre-procedure details:    Preparation:  Patient was prepped and draped in usual  sterile fashion Exploration:    Imaging outcome: foreign body not noted     Wound exploration: wound explored through full range of motion and entire depth of wound visualized     Wound extent: no foreign body, no tendon damage and no vascular damage     Contaminated: no   Treatment:    Area cleansed with:  Soap and water   Amount of cleaning:  Standard Skin repair:    Repair method:  Tissue adhesive Approximation:    Approximation:  Close Repair type:    Repair type:  Simple Post-procedure details:    Dressing:  Open (no dressing)   Procedure completion:  Tolerated well, no immediate complications   ED Course and Medical Decision Making  I have reviewed the  triage vital signs, the nursing notes, and pertinent available records from the EMR.  Social Determinants Affecting Complexity of Care: Patient has no clinically significant social determinants affecting this chief complaint..   ED Course:    Medical Decision Making Amount and/or Complexity of Data Reviewed Labs: ordered.     Consultants:  TTS.  Treatment and Plan: Dispo per TTS.    Final Clinical Impressions(s) / ED Diagnoses     ICD-10-CM   1. Deliberate self-cutting  Z72.89       ED Discharge Orders     None         Discharge Instructions Discussed with and Provided to Patient:   Discharge Instructions   None      Montine Circle, PA-C 03/28/22 Elizabeth, Nipomo, MD 03/29/22 [phone]

## 2022-03-28 NOTE — ED Provider Notes (Signed)
  Physical Exam  BP 125/74 (BP Location: Right Arm)   Pulse 80   Temp 97.7 F (36.5 C) (Oral)   Resp 16   SpO2 100%   Physical Exam  Procedures  Procedures  ED Course / MDM    Medical Decision Making Amount and/or Complexity of Data Reviewed Labs: ordered.   I was informed by nurse that patient has been transferred to behavioral health.  She requested I put in a disposition.       Davonna Belling, MD 03/28/22 [phone]

## 2022-03-28 NOTE — ED Notes (Signed)
Pt threatening to leave and getting verbally abusive. Security called to bedside.

## 2022-03-28 NOTE — ED Triage Notes (Signed)
Arrives EMS for suicidal ideation and attempt.   Small superficial laceration to left anterior wrist from box cutter.

## 2022-03-29 ENCOUNTER — Encounter (HOSPITAL_COMMUNITY): Payer: Self-pay

## 2022-03-29 DIAGNOSIS — G4700 Insomnia, unspecified: Secondary | ICD-10-CM

## 2022-03-29 DIAGNOSIS — F322 Major depressive disorder, single episode, severe without psychotic features: Principal | ICD-10-CM

## 2022-03-29 DIAGNOSIS — F410 Panic disorder [episodic paroxysmal anxiety] without agoraphobia: Secondary | ICD-10-CM

## 2022-03-29 DIAGNOSIS — F4310 Post-traumatic stress disorder, unspecified: Secondary | ICD-10-CM

## 2022-03-29 MED ORDER — CLONAZEPAM 0.5 MG PO TABS: 0.2500 mg | ORAL_TABLET | Freq: Every day | ORAL | Status: DC

## 2022-03-29 MED ORDER — DIPHENHYDRAMINE HCL 50 MG/ML IJ SOLN: 50.0000 mg | Freq: Three times a day (TID) | INTRAMUSCULAR | Status: DC | PRN

## 2022-03-29 MED ORDER — DIPHENHYDRAMINE HCL 25 MG PO CAPS
50.0000 mg | ORAL_CAPSULE | Freq: Three times a day (TID) | ORAL | Status: DC | PRN
  Administered 2022-03-29: 50 mg via ORAL
  Filled 2022-03-29: qty 2

## 2022-03-29 MED ORDER — SERTRALINE HCL 25 MG PO TABS
25.0000 mg | ORAL_TABLET | Freq: Every day | ORAL | Status: AC
  Administered 2022-03-29: 25 mg via ORAL
  Filled 2022-03-29: qty 1

## 2022-03-29 MED ORDER — CLONAZEPAM 0.25 MG PO TBDP
0.2500 mg | ORAL_TABLET | Freq: Every day | ORAL | Status: DC
  Filled 2022-03-29: qty 1

## 2022-03-29 MED ORDER — ALBUTEROL SULFATE HFA 108 (90 BASE) MCG/ACT IN AERS: 1.0000 | INHALATION_SPRAY | Freq: Four times a day (QID) | RESPIRATORY_TRACT | Status: DC | PRN

## 2022-03-29 MED ORDER — HYDROXYZINE HCL 25 MG PO TABS
25.0000 mg | ORAL_TABLET | Freq: Three times a day (TID) | ORAL | Status: DC | PRN
  Administered 2022-03-29 – 2022-04-01 (×4): 25 mg via ORAL
  Filled 2022-03-29 (×4): qty 1
  Filled 2022-03-29: qty 10

## 2022-03-29 MED ORDER — TETANUS-DIPHTHERIA TOXOIDS TD 5-2 LFU IM INJ
0.5000 mL | INJECTION | Freq: Once | INTRAMUSCULAR | Status: AC
  Administered 2022-03-29: 0.5 mL via INTRAMUSCULAR
  Filled 2022-03-29: qty 0.5

## 2022-03-29 MED ORDER — LORAZEPAM 1 MG PO TABS
2.0000 mg | ORAL_TABLET | Freq: Three times a day (TID) | ORAL | Status: DC | PRN
  Administered 2022-03-29: 2 mg via ORAL
  Filled 2022-03-29 (×2): qty 2

## 2022-03-29 MED ORDER — HALOPERIDOL LACTATE 5 MG/ML IJ SOLN: 5.0000 mg | Freq: Three times a day (TID) | INTRAMUSCULAR | Status: DC | PRN

## 2022-03-29 MED ORDER — IBUPROFEN 400 MG PO TABS
400.0000 mg | ORAL_TABLET | Freq: Four times a day (QID) | ORAL | Status: DC | PRN
  Administered 2022-03-29 – 2022-04-01 (×5): 400 mg via ORAL
  Filled 2022-03-29 (×5): qty 1

## 2022-03-29 MED ORDER — LORAZEPAM 1 MG PO TABS
1.0000 mg | ORAL_TABLET | Freq: Every day | ORAL | Status: DC | PRN
  Administered 2022-03-29: 1 mg via ORAL

## 2022-03-29 MED ORDER — LORAZEPAM 2 MG/ML IJ SOLN: 2.0000 mg | Freq: Three times a day (TID) | INTRAMUSCULAR | Status: DC | PRN

## 2022-03-29 MED ORDER — PRAZOSIN HCL 1 MG PO CAPS
1.0000 mg | ORAL_CAPSULE | Freq: Every day | ORAL | Status: DC
  Administered 2022-03-29 – 2022-03-31 (×3): 1 mg via ORAL
  Filled 2022-03-29 (×4): qty 1
  Filled 2022-03-29: qty 7
  Filled 2022-03-29: qty 1

## 2022-03-29 MED ORDER — NICOTINE POLACRILEX 2 MG MT GUM: 2.0000 mg | CHEWING_GUM | OROMUCOSAL | Status: DC | PRN

## 2022-03-29 MED ORDER — HALOPERIDOL 5 MG PO TABS
5.0000 mg | ORAL_TABLET | Freq: Three times a day (TID) | ORAL | Status: DC | PRN
  Administered 2022-03-29: 5 mg via ORAL
  Filled 2022-03-29: qty 1

## 2022-03-29 MED ORDER — SERTRALINE HCL 50 MG PO TABS
50.0000 mg | ORAL_TABLET | Freq: Every day | ORAL | Status: DC
  Administered 2022-03-30 – 2022-03-31 (×2): 50 mg via ORAL
  Filled 2022-03-29 (×3): qty 1

## 2022-03-29 NOTE — Progress Notes (Signed)
[facility] Group Notes:  (Nursing/MHT/Case Management/Adjunct)  Date:  03/29/2022  Time:  2000  Type of Therapy:   wrap up group  Participation Level:  Active  Participation Quality:  Appropriate, Attentive, Sharing, and Supportive  Affect:  Appropriate  Cognitive:  Alert  Insight:  Improving  Engagement in Group:  Engaged  Modes of Intervention:  Clarification, Education, and Support  Summary of Progress/Problems: Positive thinking and positive change were discussed.  Caitlin [name] 03/29/2022, 9:05 PM
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
## 2022-03-29 NOTE — H&P (Signed)
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 depressive disorder), single episode, severe , no psychosis (HCC) Active Problems:   Seizures (HCC)   Asthma   Generalized anxiety disorder with panic attacks   PTSD (post-traumatic stress disorder)   Insomnia  Medications -Start Klonopin 0.25 mg x 2 doses for GAD, then stop -Start Zoloft 25 mg today, then increased to 50 mg tomorrow (03/30/22) for MDD  -Start prazosin 1 mg nightly  for PTSD  -Start trazodone 50 mg nightly as needed for sleep  -Start Nicorette gum as needed for nicotine dependence  -Start agitation protocol: Haldol/Ativan/Benadryl as needed for agitation - See Mar  -Start albuterol inhaler as needed every 6 hours for wheezing/shortness of breath r/t asthma   -Start hydroxyzine 25 mg every 6 hours PRN for anxiety  -Give tetanus vaccine once for tetanus prophylaxis  Other PRNS -Start ibuprofen 400 mg as needed every 6 hours for moderate pain -Continue Tylenol 650 mg every 6 hours PRN for mild pain -Continue Maalox 30 mg every 4 hrs PRN for indigestion -Continue Milk of Magnesia as needed every 6 hrs for constipation  Patient educated on rationales, benefits, and possible side effects of all medications as listed above, verbalizes understanding, and is agreeable to trials.  Discharge Planning: Social work and case management to assist with discharge planning and identification of hospital follow-up needs prior to discharge Estimated LOS: 5-7 days Discharge Concerns: Need to establish a safety plan; Medication compliance and effectiveness Discharge Goals: Return home with outpatient referrals for mental health follow-up including medication management/psychotherapy  I certify that inpatient services furnished can reasonably be expected to improve the patient's condition.    Starleen Blue, NP 11/1/20234:32 PM

## 2022-03-29 NOTE — Progress Notes (Signed)
Patient ID: Caitlin [name], female   DOB: 18-Feb-1986, 36 y.o.   MRN: [id number] Pt woke up crying about the "notifications about bills due" before admission. Pt stated she knows she need the help but is worried about her job and getting behind on bills. Medication administered as prescribed. Plan of care ongoing.

## 2022-03-29 NOTE — BH IP Treatment Plan (Signed)
Interdisciplinary Treatment and Diagnostic Plan Update  03/29/2022 Time of Session: 1000 Caitlin [name] MRN: [id number]  Principal Diagnosis: MDD (major depressive disorder)  Secondary Diagnoses: Principal Problem:   MDD (major depressive disorder)   Current Medications:  Current Facility-Administered Medications  Medication Dose Route Frequency Provider Last Rate Last Admin   acetaminophen (TYLENOL) tablet 650 mg  650 mg Oral Q6H PRN Derrill Center, NP   650 mg at 03/28/22 2124   albuterol (VENTOLIN HFA) 108 (90 Base) MCG/ACT inhaler 1-2 puff  1-2 puff Inhalation Q6H PRN Massengill, Ovid Curd, MD       alum & mag hydroxide-simeth (MAALOX/MYLANTA) 200-200-20 MG/5ML suspension 30 mL  30 mL Oral Q4H PRN Derrill Center, NP       haloperidol (HALDOL) tablet 5 mg  5 mg Oral TID PRN Janine Limbo, MD       And   LORazepam (ATIVAN) tablet 2 mg  2 mg Oral TID PRN Janine Limbo, MD       And   diphenhydrAMINE (BENADRYL) capsule 50 mg  50 mg Oral TID PRN Massengill, Ovid Curd, MD       haloperidol lactate (HALDOL) injection 5 mg  5 mg Intramuscular TID PRN Massengill, Ovid Curd, MD       And   LORazepam (ATIVAN) injection 2 mg  2 mg Intramuscular TID PRN Massengill, Ovid Curd, MD       And   diphenhydrAMINE (BENADRYL) injection 50 mg  50 mg Intramuscular TID PRN Massengill, Ovid Curd, MD       hydrOXYzine (ATARAX) tablet 25 mg  25 mg Oral TID PRN Janine Limbo, MD       ibuprofen (ADVIL) tablet 400 mg  400 mg Oral Q6H PRN Nicholes Rough, NP       LORazepam (ATIVAN) tablet 1 mg  1 mg Oral Daily PRN Massengill, Ovid Curd, MD   1 mg at 03/29/22 1117   magnesium hydroxide (MILK OF MAGNESIA) suspension 30 mL  30 mL Oral Daily PRN Derrill Center, NP       nicotine polacrilex (NICORETTE) gum 2 mg  2 mg Oral PRN Massengill, Ovid Curd, MD       prazosin (MINIPRESS) capsule 1 mg  1 mg Oral QHS Massengill, Nathan, MD       sertraline (ZOLOFT) tablet 25 mg  25 mg Oral Daily Massengill, Nathan, MD        Followed by   Derrill Memo ON 03/30/2022] sertraline (ZOLOFT) tablet 50 mg  50 mg Oral Daily Massengill, Nathan, MD       traZODone (DESYREL) tablet 50 mg  50 mg Oral QHS PRN Derrill Center, NP   50 mg at 03/28/22 2124   PTA Medications: Medications Prior to Admission  Medication Sig Dispense Refill Last Dose   doxylamine, Sleep, (UNISOM) 25 MG tablet Take 25 mg by mouth at bedtime.       Patient Stressors: Financial difficulties   Marital or family conflict   Medication change or noncompliance   Occupational concerns    Patient Strengths: Ability for Systems developer fund of knowledge  Motivation for treatment/growth  Work skills   Treatment Modalities: Medication Management, Group therapy, Case management,  1 to 1 session with clinician, Psychoeducation, Recreational therapy.   Physician Treatment Plan for Primary Diagnosis: MDD (major depressive disorder) Long Term Goal(s):     Short Term Goals:    Medication Management: Evaluate patient's response, side effects, and tolerance of medication regimen.  Therapeutic Interventions: 1 to 1 sessions,  Unit Group sessions and Medication administration.  Evaluation of Outcomes: Progressing  Physician Treatment Plan for Secondary Diagnosis: Principal Problem:   MDD (major depressive disorder)  Long Term Goal(s):     Short Term Goals:       Medication Management: Evaluate patient's response, side effects, and tolerance of medication regimen.  Therapeutic Interventions: 1 to 1 sessions, Unit Group sessions and Medication administration.  Evaluation of Outcomes: Progressing   RN Treatment Plan for Primary Diagnosis: MDD (major depressive disorder) Long Term Goal(s): Knowledge of disease and therapeutic regimen to maintain health will improve  Short Term Goals: Ability to remain free from injury will improve and Ability to verbalize frustration and anger appropriately will improve  Medication Management: RN  will administer medications as ordered by provider, will assess and evaluate patient's response and provide education to patient for prescribed medication. RN will report any adverse and/or side effects to prescribing provider.  Therapeutic Interventions: 1 on 1 counseling sessions, Psychoeducation, Medication administration, Evaluate responses to treatment, Monitor vital signs and CBGs as ordered, Perform/monitor CIWA, COWS, AIMS and Fall Risk screenings as ordered, Perform wound care treatments as ordered.  Evaluation of Outcomes: Progressing   LCSW Treatment Plan for Primary Diagnosis: MDD (major depressive disorder) Long Term Goal(s): Safe transition to appropriate next level of care at discharge, Engage patient in therapeutic group addressing interpersonal concerns.  Short Term Goals: Engage patient in aftercare planning with referrals and resources, Increase social support, Increase ability to appropriately verbalize feelings, Increase emotional regulation, Facilitate acceptance of mental health diagnosis and concerns, Facilitate patient progression through stages of change regarding substance use diagnoses and concerns, Identify triggers associated with mental health/substance abuse issues, and Increase skills for wellness and recovery  Therapeutic Interventions: Assess for all discharge needs, 1 to 1 time with Social worker, Explore available resources and support systems, Assess for adequacy in community support network, Educate family and significant other(s) on suicide prevention, Complete Psychosocial Assessment, Interpersonal group therapy.  Evaluation of Outcomes: Progressing   Progress in Treatment: Attending groups: No. Participating in groups: No. Taking medication as prescribed: Yes. Toleration medication: Yes. Family/Significant other contact made: No, will contact:  CSW will obtain consent to reach collateral  Patient understands diagnosis: No. Discussing patient  identified problems/goals with staff: Yes. Medical problems stabilized or resolved: Yes. Denies suicidal/homicidal ideation: No. Issues/concerns per patient self-inventory: Yes. Other: none   New problem(s) identified: No, Describe:  none  New Short Term/Long Term Goal(s): Patient to work towards medication management for mood stabilization; elimination of SI thoughts; development of comprehensive mental wellness plan.  Patient Goals:  Patient states their goal for treatment is to " get control of my anxiety and depression"  Discharge Plan or Barriers: No psychosocial barriers identified at this time, patient to return to place of residence when appropriate for discharge.   Reason for Continuation of Hospitalization: Depression Medication stabilization  Estimated Length of Stay: 1-7 days   Last 3 [place] Suicide Severity Risk Score: [place] Admission (Current) from 03/28/2022 in [facility] Most recent reading at 03/28/2022  8:26 PM ED from 03/28/2022 in [facility] Most recent reading at 03/28/2022  4:16 AM ED from 09/08/2021 in [facility] Most recent reading at 09/08/2021 12:19 PM  C-SSRS RISK CATEGORY Low Risk No Risk No Risk       Last PHQ 2/9 Scores:    04/03/2019    3:18 PM 10/08/2015    2:37  PM  Depression screen PHQ 2/9  Decreased Interest 3 3  Down, Depressed, Hopeless 3 2  PHQ - 2 Score 6 5  Altered sleeping 3 3  Tired, decreased energy 3 3  Change in appetite 2 2  Feeling bad or failure about yourself  3 2  Trouble concentrating 3 2  Moving slowly or fidgety/restless 2 1  Suicidal thoughts 2 0  PHQ-9 Score 24 18  Difficult doing work/chores Extremely dIfficult     Scribe for Treatment Team: Larose Kells 03/29/2022 2:05 PM

## 2022-03-29 NOTE — BHH Suicide Risk Assessment (Signed)
[facility] INPATIENT:  Family/Significant Other Suicide Prevention Education  Suicide Prevention Education:  Education Completed; Caitlin [name] [phone]  has been identified by the patient as the family member/significant other with whom the patient will be residing, and identified as the person(s) who will aid the patient in the event of a mental health crisis (suicidal ideations/suicide attempt).  With written consent from the patient, the family member/significant other has been provided the following suicide prevention education, prior to the and/or following the discharge of the patient.  The suicide prevention education provided includes the following: Suicide risk factors Suicide prevention and interventions National Suicide Hotline telephone number [facility] assessment telephone number [facility] Emergency Assistance [place] and/or Residential Mobile Crisis Unit telephone number  Request made of family/significant other to: Remove weapons (e.g., guns, rifles, knives), all items previously/currently identified as safety concern.   Remove drugs/medications (over-the-counter, prescriptions, illicit drugs), all items previously/currently identified as a safety concern.  -Caitlin [name] (Partner) [phone] verbalizes understanding of the suicide prevention education information provided.  The family member/significant other agrees to remove the items of safety concern listed above.  [place] MSW, LCSW 03/29/2022, 3:33 PM

## 2022-03-29 NOTE — Progress Notes (Addendum)
D: Pt denied HI/AVH this morning. Pt endorsed passive SI without a plan and verbally contracted for safety. Pt rated her depression a 1/10, anxiety a 1/10, and feelings of hopelessness a 3/10. Pt became visibly anxious this morning following her discussion with her nurse practitioner. Pt appeared to be tearful and hyperventilating throughout anxiety episode. Pt complained about pain from a cut on her left wrist.   A: RN provided support and encouragement to patient. Pt given scheduled medications as prescribed. PRN Ativan and Hydroxyzine given to pt for anxiety. PRN ibuprofen given for wrist cut pain. Q15 min checks verified for safety. EKG completed. Urine sample cup provided to pt.   R: Patient verbally contracts for safety. Patient compliant with medications and treatment plan. Patient is interacting well on the unit. Pt is safe on the unit.   03/29/22 1241  Psych Admission Type (Psych Patients Only)  Admission Status Involuntary  Psychosocial Assessment  Patient Complaints Anxiety;Depression;Panic attack  Eye Contact Brief  Facial Expression Anxious;Sad  Affect Anxious  Speech Slow  Interaction Assertive  Motor Activity Slow  Appearance/Hygiene Unremarkable  Behavior Characteristics Anxious  Mood Anxious;Sad  Thought Process  Coherency WDL  Content WDL  Delusions WDL  Perception WDL  Hallucination None reported or observed  Judgment Poor  Confusion None  Danger to Self  Current suicidal ideation? Passive  Self-Injurious Behavior Some self-injurious ideation observed or expressed.  No lethal plan expressed   Agreement Not to Harm Self Yes  Description of Agreement Pt verbally contracts for safety  Danger to Others  Danger to Others None reported or observed

## 2022-03-29 NOTE — Progress Notes (Signed)
Patient did not attend morning orientation group.  

## 2022-03-29 NOTE — Group Note (Signed)
Recreation Therapy Group Note   Group Topic:Personal Development  Group Date: 03/29/2022 Start Time: 9509 End Time: 1435 Facilitators: Terin Dierolf-McCall, LRT,CTRS Location: 300 Hall Dayroom   Activity Description/Intervention: Therapeutic Drumming. Patients with peers and staff were given the opportunity to engage in a leader facilitated [place] with staff from the [employer], in partnership with [employer]. Nurse, adult and trained [employer], Devin Going leading with LRT observing and documenting intervention and pt response. This evidenced-based practice targets 7 areas of health and wellbeing in the human experience including: stress-reduction, exercise, self-expression, camaraderie/support, nurturing, spirituality, and music-making (leisure).   Goal Area(s) Addresses:  Patient will engage in pro-social way in music group.  Patient will follow directions of drum leader on the first prompt. Patient will demonstrate no behavioral issues during group.  Patient will identify if a reduction in stress level occurs as a result of participation in therapeutic drum circle.     Affect/Mood: Appropriate   Participation Level: Engaged   Participation Quality: Independent   Behavior: Appropriate   Speech/Thought Process: Focused    Clinical Observations/Individualized Feedback:  Patient actively engaged in therapeutic drumming exercise and discussions. Pt was appropriate with peers, staff, and musical equipment for duration of programming.   Plan: Continue to engage patient in RT group sessions 2-3x/week.   Tylicia Sherman-McCall, LRT,CTRS 03/29/2022 3:34 PM
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
## 2022-03-29 NOTE — Group Note (Signed)
LCSW Group Therapy Note   Group Date: 03/29/2022 Start Time: 1300 End Time: 1400   Type of Therapy and Topic:  Group Therapy: Boundaries  Participation Level:  Active  Description of Group: This group will address the use of boundaries in their personal lives. Patients will explore why boundaries are important, the difference between healthy and unhealthy boundaries, and negative and postive outcomes of different boundaries and will look at how boundaries can be crossed.  Patients will be encouraged to identify current boundaries in their own lives and identify what kind of boundary is being set. Facilitators will guide patients in utilizing problem-solving interventions to address and correct types boundaries being used and to address when no boundary is being used. Understanding and applying boundaries will be explored and addressed for obtaining and maintaining a balanced life. Patients will be encouraged to explore ways to assertively make their boundaries and needs known to significant others in their lives, using other group members and facilitator for role play, support, and feedback.  Therapeutic Goals:  1.  Patient will identify areas in their life where setting clear boundaries could be  used to improve their life.  2.  Patient will identify signs/triggers that a boundary is not being respected. 3.  Patient will identify two ways to set boundaries in order to achieve balance in  their lives: 4.  Patient will demonstrate ability to communicate their needs and set boundaries  through discussion and/or role plays  Summary of Patient Progress:  Caitlin [name] was present/active throughout the session and proved open to feedback from [place] and peers. Patient demonstrated appropriate insight into the subject matter, was respectful of peers, and was present throughout the entire session.  Therapeutic Modalities:   Cognitive Behavioral Therapy Solution-Focused Therapy  Windle Guard,  LCSW 03/29/2022  2:27 PM
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
## 2022-03-30 DIAGNOSIS — F322 Major depressive disorder, single episode, severe without psychotic features: Secondary | ICD-10-CM | POA: Diagnosis not present

## 2022-03-30 DIAGNOSIS — N390 Urinary tract infection, site not specified: Secondary | ICD-10-CM | POA: Insufficient documentation

## 2022-03-30 LAB — URINALYSIS, ROUTINE W REFLEX MICROSCOPIC
Glucose, UA: NEGATIVE mg/dL
Hgb urine dipstick: NEGATIVE
Ketones, ur: 5 mg/dL — AB
Nitrite: NEGATIVE
Protein, ur: 100 mg/dL — AB
Specific Gravity, Urine: 1.034 — ABNORMAL HIGH (ref 1.005–1.030)
pH: 5 (ref 5.0–8.0)

## 2022-03-30 MED ORDER — NITROFURANTOIN MONOHYD MACRO 100 MG PO CAPS
100.0000 mg | ORAL_CAPSULE | Freq: Two times a day (BID) | ORAL | Status: DC
  Administered 2022-03-30 – 2022-04-01 (×5): 100 mg via ORAL
  Filled 2022-03-30 (×2): qty 1
  Filled 2022-03-30: qty 10
  Filled 2022-03-30: qty 1
  Filled 2022-03-30: qty 10
  Filled 2022-03-30 (×4): qty 1

## 2022-03-30 MED ORDER — LORAZEPAM 1 MG PO TABS: 1.0000 mg | ORAL_TABLET | Freq: Two times a day (BID) | ORAL | Status: DC | PRN

## 2022-03-30 MED ORDER — NITROFURANTOIN MONOHYD MACRO 100 MG PO CAPS: 100.0000 mg | ORAL_CAPSULE | Freq: Two times a day (BID) | ORAL | Status: DC

## 2022-03-30 MED ORDER — ONDANSETRON HCL 4 MG PO TABS
4.0000 mg | ORAL_TABLET | Freq: Three times a day (TID) | ORAL | Status: DC | PRN
  Administered 2022-03-30: 4 mg via ORAL
  Filled 2022-03-30: qty 1

## 2022-03-30 NOTE — Progress Notes (Signed)
The focus of this group is to help patients review their daily goal of treatment and discuss progress on daily workbooks.  Pt attended the evening group and responded to all discussion prompts from the [place]. Pt shared that today was a good day on the unit, the highlight of which was waking up feeling generally positive. "That lasted throughout my whole day. I didn't have any anxious episodes at all."  On the subject of staying well upon discharge, Caitlin [name] mentioned wanting to exercise more and with her children if possible. In addition to the health benefits, she is also hopeful this could aid in bonding.  Pt rated her day an 8 out of 10 and her affect was appropriate.

## 2022-03-30 NOTE — Progress Notes (Addendum)
D: Pt denied SI/HI/AVH this morning. Pt rated her depression a 4/10, and anxiety a 4/10. Pt was difficult to arouse this morning. When pt woke up she reported that she felt lightheaded and dizzy. Pt reported feeling "sick to my stomach" and began to dry heave/vomit into a paper bag outside of the dayroom. Pt has been pleasant, calm, and cooperative throughout the shift.   A: RN provided support and encouragement to patient. Pt given scheduled medications as prescribed. PRN Zofran given for nausea. RN provided ginger ale and crackers. PRN Tylenol given for wrist cut pain. Q15 min checks verified for safety.    R: Patient verbally contracts for safety. Patient compliant with medications and treatment plan. Patient is interacting well on the unit. Pt nausea symptoms improved following taking Zofran. Pt is safe on the unit.   03/30/22 1204  Psych Admission Type (Psych Patients Only)  Admission Status Involuntary  Psychosocial Assessment  Patient Complaints Anxiety;Depression  Eye Contact Brief  Facial Expression Anxious;Sad  Affect Anxious  Speech Slow  Interaction Assertive  Motor Activity Slow;Unsteady  Appearance/Hygiene Unremarkable  Behavior Characteristics Anxious;Cooperative  Mood Anxious;Sad  Thought Process  Coherency WDL  Content WDL  Delusions WDL;None reported or observed  Perception WDL  Hallucination None reported or observed  Judgment Impaired  Confusion None  Danger to Self  Current suicidal ideation? Denies  Self-Injurious Behavior No self-injurious ideation or behavior indicators observed or expressed   Agreement Not to Harm Self Yes  Description of Agreement Pt verbally contracts for safety  Danger to Others  Danger to Others None reported or observed

## 2022-03-30 NOTE — Progress Notes (Signed)
[facility] MD Progress Note  03/30/2022 1:23 PM Caitlin [name]  MRN:  [id number] Principal Problem: MDD (major depressive disorder), single episode, severe , no psychosis (HCC) Diagnosis: Principal Problem:   MDD (major depressive disorder), single episode, severe , no psychosis (HCC) Active Problems:   Asthma   Generalized anxiety disorder with panic attacks   PTSD (post-traumatic stress disorder)   Insomnia   UTI (urinary tract infection)  Reason For Admission:  Caitlin [name] is a 36 yo [place] American female with no prior mental health history who was taken by EMS to the [facility] ER after using a box cutter to cut herself in the left wrist with an intent to end her life. Pt was transferred to this [facility] for treatment and stabilization of her mood.   24 hour chart review: V/S for past 24 hours WNL. Pt compliant with scheduled medications.  As needed medications for the past 24 hours are as follows; Ativan 2 mg, Haldol 5 mg, Benadryl 50 mg all p.o. given last night. Pt had a panic attack as per nursing reports last night, which is why the above medications were administered. Pt has attended some unit group sessions over the past 24 hrs, and has actively engaged in them.   Patient assessment note (03/30/2022): Patient denies SI/HI/AVH.  She denies paranoia and there is no evidence of delusional thinking.  Mood remains depressed and anxious, and affect is congruent.  Patient reports that feeling lightheaded, dizzy, and weak earlier today morning as a result of the combination of the agitation protocol given to her last night as listed above.  Patient also feeling nauseous earlier today morning, but nausea is resolving with Zofran.  Patient reports a panic attack last night but is unable to state what triggered it.  Patient reports that her sleep quality last night was good, and she reports a poor appetite related to the nausea, and denies being in any physical distress.  Patient is tolerating being on  Zoloft.  Education provided on the fact that the nausea could be related to starting Zoloft or the agitation protocol received last night.  Patient also educated that the nausea should resolve as her system gets used to the medication.  Patient verbalized understanding.  UA with moderate leukocytes, and many bacteria, patient reports not really frequency and urgency.  We will treat with Macrobid 100 mg twice daily x 7 days for UTI.  We will continue other medications as listed below.  Total Time spent with patient: 30 minutes  Past Psychiatric History: none prior  Past Medical History:  Past Medical History:  Diagnosis Date   Asthma    Seizures (HCC)     Past Surgical History:  Procedure Laterality Date   CESAREAN SECTION     CHOLECYSTECTOMY     TUBAL LIGATION     Family History:  Family History  Problem Relation Age of Onset   Epilepsy Father    Family Psychiatric  History: See H & P Social History:  Social History   Substance and Sexual Activity  Alcohol Use Yes   Comment: occasional     Social History   Substance and Sexual Activity  Drug Use Yes   Types: Marijuana   Comment: Daily, stopped 02/2019    Social History   Socioeconomic History   Marital status: Single    Spouse name: Not on file   Number of children: Not on file   Years of education: Not on file   Highest education  level: Not on file  Occupational History   Not on file  Tobacco Use   Smoking status: Some Days    Packs/day: 0.25    Types: Cigarettes   Smokeless tobacco: Not on file   Tobacco comments:    2-3 per day   Substance and Sexual Activity   Alcohol use: Yes    Comment: occasional   Drug use: Yes    Types: Marijuana    Comment: Daily, stopped 02/2019   Sexual activity: Yes    Partners: Male    Birth control/protection: None  Other Topics Concern   Not on file  Social History Narrative   Not on file   Social Determinants of Health   Financial Resource Strain: Not on file   Food Insecurity: Food Insecurity Present (03/28/2022)   Hunger Vital Sign    Worried About Charity fundraiser in the Last Year: Often true    Ran Out of Food in the Last Year: Often true  Transportation Needs: Unmet Transportation Needs (03/28/2022)   PRAPARE - Hydrologist (Medical): No    Lack of Transportation (Non-Medical): Yes  Physical Activity: Not on file  Stress: Not on file  Social Connections: Not on file   Additional Social History:   Sleep: Good  Appetite:  Poor  Current Medications: Current Facility-Administered Medications  Medication Dose Route Frequency Provider Last Rate Last Admin   acetaminophen (TYLENOL) tablet 650 mg  650 mg Oral Q6H PRN Derrill Center, NP   650 mg at 03/28/22 2124   albuterol (VENTOLIN HFA) 108 (90 Base) MCG/ACT inhaler 1-2 puff  1-2 puff Inhalation Q6H PRN Massengill, Ovid Curd, MD       alum & mag hydroxide-simeth (MAALOX/MYLANTA) 200-200-20 MG/5ML suspension 30 mL  30 mL Oral Q4H PRN Derrill Center, NP       clonazePAM (KLONOPIN) disintegrating tablet 0.25 mg  0.25 mg Oral Daily Massengill, Nathan, MD       hydrOXYzine (ATARAX) tablet 25 mg  25 mg Oral TID PRN Janine Limbo, MD   25 mg at 03/29/22 1429   ibuprofen (ADVIL) tablet 400 mg  400 mg Oral Q6H PRN Nicholes Rough, NP   400 mg at 03/29/22 2113   magnesium hydroxide (MILK OF MAGNESIA) suspension 30 mL  30 mL Oral Daily PRN Derrill Center, NP       nicotine polacrilex (NICORETTE) gum 2 mg  2 mg Oral PRN Massengill, Nathan, MD       nitrofurantoin (macrocrystal-monohydrate) (MACROBID) capsule 100 mg  100 mg Oral Q12H Massengill, Nathan, MD   100 mg at 03/30/22 1250   ondansetron (ZOFRAN) tablet 4 mg  4 mg Oral Q8H PRN Massengill, Ovid Curd, MD   4 mg at 03/30/22 1138   prazosin (MINIPRESS) capsule 1 mg  1 mg Oral QHS Massengill, Nathan, MD   1 mg at 03/29/22 2113   sertraline (ZOLOFT) tablet 50 mg  50 mg Oral Daily Massengill, Ovid Curd, MD   50 mg at 03/30/22  1250   traZODone (DESYREL) tablet 50 mg  50 mg Oral QHS PRN Derrill Center, NP   50 mg at 03/28/22 2124    Lab Results:  Results for orders placed or performed during the hospital encounter of 03/28/22 (from the past 48 hour(s))  Urinalysis, Routine w reflex microscopic     Status: Abnormal   Collection Time: 03/30/22  7:09 AM  Result Value Ref Range   Color, Urine YELLOW YELLOW  APPearance TURBID (A) CLEAR   Specific Gravity, Urine 1.034 (H) 1.005 - 1.030   pH 5.0 5.0 - 8.0   Glucose, UA NEGATIVE NEGATIVE mg/dL   Hgb urine dipstick NEGATIVE NEGATIVE   Bilirubin Urine SMALL (A) NEGATIVE   Ketones, ur 5 (A) NEGATIVE mg/dL   Protein, ur 498 (A) NEGATIVE mg/dL   Nitrite NEGATIVE NEGATIVE   Leukocytes,Ua MODERATE (A) NEGATIVE   WBC, UA 0-5 0 - 5 WBC/hpf   Bacteria, UA MANY (A) NONE SEEN   Squamous Epithelial / LPF 6-10 0 - 5   Mucus PRESENT    Amorphous Crystal PRESENT     Comment: Performed at [facility], [address] [place], [place] [address]    Blood Alcohol level:  Lab Results  Component Value Date   ETH <10 03/28/2022    Metabolic Disorder Labs: Lab Results  Component Value Date   HGBA1C 5.3 04/03/2019   No results found for: "PROLACTIN" No results found for: "CHOL", "TRIG", "HDL", "CHOLHDL", "VLDL", "LDLCALC"  Physical Findings: AIMS: n/a CIWA:   n/a COWS: n/a    Musculoskeletal: Strength & Muscle Tone: within normal limits Gait & Station: normal Patient leans: N/A  Psychiatric Specialty Exam:  Presentation  General Appearance:  Disheveled  Eye Contact: Fair  Speech: Clear and Coherent  Speech Volume: Normal  Handedness: Right   Mood and Affect  Mood: Depressed; Anxious  Affect: Congruent   Thought Process  Thought Processes: Coherent  Descriptions of Associations:Intact  Orientation:Full (Time, Place and Person)  Thought Content:Logical  History of Schizophrenia/Schizoaffective disorder:No data  recorded Duration of Psychotic Symptoms:No data recorded Hallucinations:Hallucinations: None  Ideas of Reference:None  Suicidal Thoughts:Suicidal Thoughts: No  Homicidal Thoughts:Homicidal Thoughts: No  Sensorium  Memory: Immediate Good  Judgment: Fair  Insight: Fair  Art therapist  Concentration: Fair  Attention Span: Fair  Recall: Fair  Fund of Knowledge: Fair  Language: Fair  Psychomotor Activity  Psychomotor Activity: Psychomotor Activity: Normal  Assets  Assets: Resilience  Sleep  Sleep: Sleep: Good  Physical Exam: Physical Exam Review of Systems  Constitutional:  Negative for fever.  HENT:  Negative for hearing loss and sore throat.   Eyes: Negative.   Respiratory:  Negative for cough.   Cardiovascular:  Negative for chest pain.  Gastrointestinal:  Positive for nausea.  Genitourinary:  Positive for frequency.  Skin:  Negative for rash.  Neurological:  Positive for dizziness.  Psychiatric/Behavioral:  Positive for depression. Negative for hallucinations, memory loss, substance abuse and suicidal ideas. The patient is nervous/anxious and has insomnia.    Blood pressure (!) 114/59, pulse 75, temperature 97.9 F (36.6 C), temperature source Oral, resp. rate 16, last menstrual period 02/27/2022, SpO2 100 %. There is no height or weight on file to calculate BMI.  Treatment Plan Summary: Daily contact with patient to assess and evaluate symptoms and progress in treatment and Medication management   Observation Level/Precautions:  15 minute checks  Laboratory:  Labs reviewed   Psychotherapy:  Unit Group sessions  Medications:  See [facility]  Consultations:  To be determined   Discharge Concerns:  Safety, medication compliance, mood stability  Estimated LOS: 5-7 days  Other:  N/A    Labs reviewed on 03/30/2022: CMP WNL, CBC mostly WNL, with hemoglobin slightly low at 11 and hematocrit slightly low at 35.7.  Orders placed for HA1C, lipid panel,  TSH, vitamin B12, vitamin D ordered and not completed because pt refused. Reordered these lab, and pt educated on the importance of  complying with getting blood drawn for labs. baseline urinalysis shows UTI, treating with Macrobid.  EKG with normal sinus rhythm and QTc within normal limits at 382.   PLAN Safety and Monitoring: Voluntary admission to inpatient psychiatric unit for safety, stabilization and treatment Daily contact with patient to assess and evaluate symptoms and progress in treatment Patient's case to be discussed in multi-disciplinary team meeting Observation Level : q15 minute checks Vital signs: q12 hours Precautions: Safety   Long Term Goal(s): Improvement in symptoms so as ready for discharge   Short Term Goals: Ability to identify changes in lifestyle to reduce recurrence of condition will improve, Ability to disclose and discuss suicidal ideas, Ability to demonstrate self-control will improve, Ability to identify and develop effective coping behaviors will improve, Ability to maintain clinical measurements within normal limits will improve, Compliance with prescribed medications will improve, and Ability to identify triggers associated with substance abuse/mental health issues will improve   Diagnoses:  Principal Problem:   MDD (major depressive disorder), single episode, severe , no psychosis (HCC) Active Problems:   Seizures (HCC)   Asthma   Generalized anxiety disorder with panic attacks   PTSD (post-traumatic stress disorder)   Insomnia   Medications -Continue Klonopin 0.25 mg x 2 doses for GAD, then stop (ends 11/03 @ 0800. Dose earlier today held due to lightheadedness)  -Start Macrobid 100 mg twice daily x 7 days for UTI  -Continue Zoloft 50 mg for MDD   -Continue prazosin 1 mg nightly for PTSD   -Continue trazodone 50 mg nightly as needed for sleep   -Continue Nicorette gum as needed for nicotine dependence   -Discontinued agitation protocol:  Haldol/Ativan/Benadryl d/t sedation & lightheadedness    -Continue albuterol inhaler as needed every 6 hours for wheezing/shortness of breath r/t asthma    -Continue hydroxyzine 25 mg every 6 hours PRN for anxiety   -Given tetanus vaccine once for tetanus prophylaxis on 03/29/2022   Other PRNS -Continue ibuprofen 400 mg as needed every 6 hours for moderate pain -Continue Tylenol 650 mg every 6 hours PRN for mild pain -Continue Maalox 30 mg every 4 hrs PRN for indigestion -Continue Milk of Magnesia as needed every 6 hrs for constipation   Patient educated on rationales, benefits, and possible side effects of all medications as listed above, verbalizes understanding, and is agreeable to trials.   Discharge Planning: Social work and case management to assist with discharge planning and identification of hospital follow-up needs prior to discharge Estimated LOS: 5-7 days Discharge Concerns: Need to establish a safety plan; Medication compliance and effectiveness Discharge Goals: Return home with outpatient referrals for mental health follow-up including medication management/psychotherapy   I certify that inpatient services furnished can reasonably be expected to improve the patient's condition.     Starleen Blue, NP 03/30/2022, 1:23 PM

## 2022-03-30 NOTE — Progress Notes (Signed)

## 2022-03-31 DIAGNOSIS — F322 Major depressive disorder, single episode, severe without psychotic features: Secondary | ICD-10-CM | POA: Diagnosis not present

## 2022-03-31 LAB — HEMOGLOBIN A1C
Hgb A1c MFr Bld: 4.9 % (ref 4.8–5.6)
Mean Plasma Glucose: 93.93 mg/dL

## 2022-03-31 LAB — LIPID PANEL
Cholesterol: 173 mg/dL (ref 0–200)
HDL: 44 mg/dL (ref >40)
LDL Cholesterol: 113 mg/dL — ABNORMAL HIGH (ref 0–99)
Total CHOL/HDL Ratio: 3.9 RATIO
Triglycerides: 78 mg/dL (ref <150)
VLDL: 16 mg/dL (ref 0–40)

## 2022-03-31 LAB — VITAMIN D 25 HYDROXY (VIT D DEFICIENCY, FRACTURES): Vit D, 25-Hydroxy: 5.77 ng/mL — ABNORMAL LOW (ref 30–100)

## 2022-03-31 LAB — TSH: TSH: 1.761 u[IU]/mL (ref 0.350–4.500)

## 2022-03-31 LAB — VITAMIN B12: Vitamin B-12: 278 pg/mL (ref 180–914)

## 2022-03-31 MED ORDER — BACITRACIN ZINC 500 UNIT/GM EX OINT
TOPICAL_OINTMENT | Freq: Two times a day (BID) | CUTANEOUS | Status: DC
  Administered 2022-03-31: 31.5556 via TOPICAL
  Administered 2022-03-31: 1 via TOPICAL
  Administered 2022-04-01: 31.5556 via TOPICAL
  Filled 2022-03-31: qty 28.35

## 2022-03-31 MED ORDER — SERTRALINE HCL 25 MG PO TABS
25.0000 mg | ORAL_TABLET | Freq: Every day | ORAL | Status: AC
  Administered 2022-03-31: 25 mg via ORAL
  Filled 2022-03-31: qty 1

## 2022-03-31 MED ORDER — SERTRALINE HCL 100 MG PO TABS
100.0000 mg | ORAL_TABLET | Freq: Every day | ORAL | Status: DC
  Administered 2022-04-01: 100 mg via ORAL
  Filled 2022-03-31: qty 7
  Filled 2022-03-31 (×2): qty 1

## 2022-03-31 NOTE — Group Note (Signed)
LCSW Group Therapy Note   Group Date: 03/31/2022 Start Time: 1300 End Time: 1400   Type of Therapy and Topic:  Group Therapy: Boundaries  Participation Level:  Active  Description of Group: This group will address the use of boundaries in their personal lives. Patients will explore why boundaries are important, the difference between healthy and unhealthy boundaries, and negative and postive outcomes of different boundaries and will look at how boundaries can be crossed.  Patients will be encouraged to identify current boundaries in their own lives and identify what kind of boundary is being set. Facilitators will guide patients in utilizing problem-solving interventions to address and correct types boundaries being used and to address when no boundary is being used. Understanding and applying boundaries will be explored and addressed for obtaining and maintaining a balanced life. Patients will be encouraged to explore ways to assertively make their boundaries and needs known to significant others in their lives, using other group members and facilitator for role play, support, and feedback.  Therapeutic Goals:  1.  Patient will identify areas in their life where setting clear boundaries could be  used to improve their life.  2.  Patient will identify signs/triggers that a boundary is not being respected. 3.  Patient will identify two ways to set boundaries in order to achieve balance in  their lives: 4.  Patient will demonstrate ability to communicate their needs and set boundaries  through discussion and/or role plays  Summary of Patient Progress:  Caitlin [name] was present/active throughout the session and proved open to feedback from [place] and peers. Patient demonstrated good insight into the subject matter, was respectful of peers, and was present throughout the entire session.  Therapeutic Modalities:   Cognitive Behavioral Therapy Solution-Focused Therapy  Windle Guard, LCSW 03/31/2022   1:55 PM

## 2022-03-31 NOTE — Progress Notes (Signed)
[facility] MD Progress Note  03/31/2022 2:46 PM Caitlin [name]  MRN:  [id number] Principal Problem: MDD (major depressive disorder), single episode, severe , no psychosis (HCC) Diagnosis: Principal Problem:   MDD (major depressive disorder), single episode, severe , no psychosis (HCC) Active Problems:   Asthma   Generalized anxiety disorder with panic attacks   PTSD (post-traumatic stress disorder)   Insomnia   UTI (urinary tract infection)  Reason For Admission:  Caitlin [name] is a 36 yo [place] American female with no prior mental health history who was taken by EMS to the [facility] ER after using a box cutter to cut herself in the left wrist with an intent to end her life. Pt was transferred to this [facility] for treatment and stabilization of her mood.   24 hour chart review: V/S for past 24 hours WNL. Pt compliant with scheduled medications.  As needed medications for the past 24 hours are as follows; hydroxyzine last night for anxiety, ibuprofen earlier today morning for pain. Patient is attending group sessions, and is participating.  She has been appropriate in the milieu for the past 24 hours and no behavioral episodes have been noted.  Assessment note (03/31/2022): Mood today is euthymic, pt reports that her depressive symptoms have significantly improved since admission. She currently denies SI, denies HI, and denies AVH. She denies paranoia and there is no evidence of delusional thinking.   Patient reports a good sleep quality last night, reports a fair appetite, and denies being in any physical pain.  Patient reports that her nausea has resolved. Thoughts contents are logical, speech is clear and coherent.  She continues to be visible in the milieu interacting with peers and staff and also attending group sessions.  Patient verbalizes readiness to be discharged tomorrow 04/01/2022.  We will plan to discharge tomorrow as requested as patient's mental status is stabilizing.  Zoloft increased to  75 mg today for management of depressive symptoms, will increase to 100 mg tomorrow 04/01/2022 for management of depressive symptoms and anxiety.  We will continue other medications as listed below.  Patient denies any medication related side effects today.  Neosporin ordered to be applied twice daily to left wrist wound.  Total Time spent with patient: 30 minutes  Past Psychiatric History: none prior  Past Medical History:  Past Medical History:  Diagnosis Date   Asthma    Seizures (HCC)     Past Surgical History:  Procedure Laterality Date   CESAREAN SECTION     CHOLECYSTECTOMY     TUBAL LIGATION     Family History:  Family History  Problem Relation Age of Onset   Epilepsy Father    Family Psychiatric  History: See H & P Social History:  Social History   Substance and Sexual Activity  Alcohol Use Yes   Comment: occasional     Social History   Substance and Sexual Activity  Drug Use Yes   Types: Marijuana   Comment: Daily, stopped 02/2019    Social History   Socioeconomic History   Marital status: Single    Spouse name: Not on file   Number of children: Not on file   Years of education: Not on file   Highest education level: Not on file  Occupational History   Not on file  Tobacco Use   Smoking status: Some Days    Packs/day: 0.25    Types: Cigarettes   Smokeless tobacco: Not on file   Tobacco comments:  2-3 per day   Substance and Sexual Activity   Alcohol use: Yes    Comment: occasional   Drug use: Yes    Types: Marijuana    Comment: Daily, stopped 02/2019   Sexual activity: Yes    Partners: Male    Birth control/protection: None  Other Topics Concern   Not on file  Social History Narrative   Not on file   Social Determinants of Health   Financial Resource Strain: Not on file  Food Insecurity: Food Insecurity Present (03/28/2022)   Hunger Vital Sign    Worried About Running Out of Food in the Last Year: Often true    Ran Out of Food in  the Last Year: Often true  Transportation Needs: Unmet Transportation Needs (03/28/2022)   PRAPARE - Administrator, Civil Service (Medical): No    Lack of Transportation (Non-Medical): Yes  Physical Activity: Not on file  Stress: Not on file  Social Connections: Not on file   Additional Social History:   Sleep: Good  Appetite:  Poor  Current Medications: Current Facility-Administered Medications  Medication Dose Route Frequency Provider Last Rate Last Admin   acetaminophen (TYLENOL) tablet 650 mg  650 mg Oral Q6H PRN Oneta Rack, NP   650 mg at 03/28/22 2124   albuterol (VENTOLIN HFA) 108 (90 Base) MCG/ACT inhaler 1-2 puff  1-2 puff Inhalation Q6H PRN Massengill, Harrold Donath, MD       alum & mag hydroxide-simeth (MAALOX/MYLANTA) 200-200-20 MG/5ML suspension 30 mL  30 mL Oral Q4H PRN Oneta Rack, NP       bacitracin ointment   Topical BID Starleen Blue, NP       hydrOXYzine (ATARAX) tablet 25 mg  25 mg Oral TID PRN Phineas Inches, MD   25 mg at 03/30/22 2115   ibuprofen (ADVIL) tablet 400 mg  400 mg Oral Q6H PRN Starleen Blue, NP   400 mg at 03/31/22 1037   LORazepam (ATIVAN) tablet 1 mg  1 mg Oral BID PRN Massengill, Harrold Donath, MD       magnesium hydroxide (MILK OF MAGNESIA) suspension 30 mL  30 mL Oral Daily PRN Oneta Rack, NP       nicotine polacrilex (NICORETTE) gum 2 mg  2 mg Oral PRN Massengill, Nathan, MD       nitrofurantoin (macrocrystal-monohydrate) (MACROBID) capsule 100 mg  100 mg Oral Q12H Massengill, Nathan, MD   100 mg at 03/31/22 0754   ondansetron (ZOFRAN) tablet 4 mg  4 mg Oral Q8H PRN Massengill, Harrold Donath, MD   4 mg at 03/30/22 1138   prazosin (MINIPRESS) capsule 1 mg  1 mg Oral QHS Massengill, Nathan, MD   1 mg at 03/30/22 2115   [START ON 04/01/2022] sertraline (ZOLOFT) tablet 100 mg  100 mg Oral Daily Barba Solt, NP       traZODone (DESYREL) tablet 50 mg  50 mg Oral QHS PRN Oneta Rack, NP   50 mg at 03/28/22 2124    Lab Results:   Results for orders placed or performed during the hospital encounter of 03/28/22 (from the past 48 hour(s))  Urinalysis, Routine w reflex microscopic     Status: Abnormal   Collection Time: 03/30/22  7:09 AM  Result Value Ref Range   Color, Urine YELLOW YELLOW   APPearance TURBID (A) CLEAR   Specific Gravity, Urine 1.034 (H) 1.005 - 1.030   pH 5.0 5.0 - 8.0   Glucose, UA NEGATIVE NEGATIVE mg/dL  Hgb urine dipstick NEGATIVE NEGATIVE   Bilirubin Urine SMALL (A) NEGATIVE   Ketones, ur 5 (A) NEGATIVE mg/dL   Protein, ur 161100 (A) NEGATIVE mg/dL   Nitrite NEGATIVE NEGATIVE   Leukocytes,Ua MODERATE (A) NEGATIVE   WBC, UA 0-5 0 - 5 WBC/hpf   Bacteria, UA MANY (A) NONE SEEN   Squamous Epithelial / LPF 6-10 0 - 5   Mucus PRESENT    Amorphous Crystal PRESENT     Comment: Performed at [facility], [address] [place], [place] [address]  Hemoglobin A1c     Status: None   Collection Time: 03/31/22  6:41 AM  Result Value Ref Range   Hgb A1c MFr Bld 4.9 4.8 - 5.6 %    Comment: (NOTE) Pre diabetes:          5.7%-6.4%  Diabetes:              >6.4%  Glycemic control for   <7.0% adults with diabetes    Mean Plasma Glucose 93.93 mg/dL    Comment: Performed at [facility], [address] [place], [place] [address]  Lipid panel     Status: Abnormal   Collection Time: 03/31/22  6:41 AM  Result Value Ref Range   Cholesterol 173 0 - 200 mg/dL   Triglycerides 78 <119<150 mg/dL   HDL 44 >14>40 mg/dL   Total CHOL/HDL Ratio 3.9 RATIO   VLDL 16 0 - 40 mg/dL   LDL Cholesterol 782113 (H) 0 - 99 mg/dL    Comment:        Total Cholesterol/HDL:CHD Risk Coronary Heart Disease Risk Table                     Men   Women  1/2 Average Risk   3.4   3.3  Average Risk       5.0   4.4  2 X Average Risk   9.6   7.1  3 X Average Risk  23.4   11.0        Use the calculated Patient Ratio above and the CHD Risk Table to determine the patient's CHD Risk.        ATP III CLASSIFICATION  (LDL):  <100     mg/dL   Optimal  [id number]  mg/dL   Near or Above                    Optimal  130-159  mg/dL   Borderline  [id number]  mg/dL   High  >469>190     mg/dL   Very High Performed at [facility], [address] [place], [place] [address]   TSH     Status: None   Collection Time: 03/31/22  6:41 AM  Result Value Ref Range   TSH 1.761 0.350 - 4.500 uIU/mL    Comment: Performed by a 3rd Generation assay with a functional sensitivity of <=0.01 uIU/mL. Performed at [facility], [address] [place], [place] [address]   VITAMIN D 25 Hydroxy (Vit-D Deficiency, Fractures)     Status: Abnormal   Collection Time: 03/31/22  6:41 AM  Result Value Ref Range   Vit D, 25-Hydroxy 5.77 (L) 30 - 100 ng/mL    Comment: (NOTE) Vitamin D deficiency has been defined by the Institute of Medicine  and an Endocrine Society practice guideline as a level of serum 25-OH  vitamin D less than 20 ng/mL (1,2). The Endocrine  Society went on to  further define vitamin D insufficiency as a level between 21 and 29  ng/mL (2).  1. IOM (Institute of Medicine). 2010. Dietary reference intakes for  calcium and D. [place]: [employer]. 2. Holick MF, Binkley Ashkum, Bischoff-Ferrari HA, et al. Evaluation,  treatment, and prevention of vitamin D deficiency: an Endocrine  Society clinical practice guideline, JCEM. 2011 Jul; 96(7): 1911-30.  Performed at [facility], [address] [place], [place] [address]   Vitamin B12     Status: None   Collection Time: 03/31/22  6:41 AM  Result Value Ref Range   Vitamin B-12 278 180 - 914 pg/mL    Comment: (NOTE) This assay is not validated for testing neonatal or myeloproliferative syndrome specimens for Vitamin B12 levels. Performed at [facility], [address], [place], [place] [address]     Blood Alcohol level:  Lab Results  Component Value Date   ETH <10 03/28/2022     Metabolic Disorder Labs: Lab Results  Component Value Date   HGBA1C 4.9 03/31/2022   MPG 93.93 03/31/2022   No results found for: "PROLACTIN" Lab Results  Component Value Date   CHOL 173 03/31/2022   TRIG 78 03/31/2022   HDL 44 03/31/2022   CHOLHDL 3.9 03/31/2022   VLDL 16 03/31/2022   LDLCALC 113 (H) 03/31/2022    Physical Findings: AIMS: n/a CIWA:   n/a COWS: n/a    Musculoskeletal: Strength & Muscle Tone: within normal limits Gait & Station: normal Patient leans: N/A  Psychiatric Specialty Exam:  Presentation  General Appearance:  Appropriate for Environment; Fairly Groomed  Eye Contact: Good  Speech: Clear and Coherent  Speech Volume: Normal  Handedness: Right   Mood and Affect  Mood: Euthymic  Affect: Congruent   Thought Process  Thought Processes: Coherent  Descriptions of Associations:Intact  Orientation:Full (Time, Place and Person)  Thought Content:Logical  History of Schizophrenia/Schizoaffective disorder:No data recorded Duration of Psychotic Symptoms:No data recorded Hallucinations:Hallucinations: None  Ideas of Reference:None  Suicidal Thoughts:Suicidal Thoughts: No  Homicidal Thoughts:Homicidal Thoughts: No  Sensorium  Memory: Immediate Good  Judgment: Fair  Insight: Fair  Art therapist  Concentration: Good  Attention Span: Good  Recall: Good  Fund of Knowledge: Good  Language: Good  Psychomotor Activity  Psychomotor Activity: Psychomotor Activity: Normal  Assets  Assets: Social Support; Desire for Improvement; Resilience  Sleep  Sleep: Sleep: Good  Physical Exam: Physical Exam Constitutional:      Appearance: Normal appearance.  HENT:     Head: Normocephalic.     Nose: No congestion.  Pulmonary:     Effort: Pulmonary effort is normal.  Musculoskeletal:     Cervical back: Normal range of motion.  Neurological:     Sensory: No sensory deficit.    Review of Systems   Constitutional:  Negative for fever.  HENT:  Negative for hearing loss and sore throat.   Eyes: Negative.   Respiratory:  Negative for cough.   Cardiovascular:  Negative for chest pain.  Gastrointestinal:  Positive for nausea.  Genitourinary:  Positive for frequency.  Skin:  Negative for rash.  Neurological:  Positive for dizziness.  Psychiatric/Behavioral:  Positive for depression. Negative for hallucinations, memory loss, substance abuse and suicidal ideas. The patient is nervous/anxious and has insomnia.    Blood pressure 104/72, pulse 79, temperature 97.8 F (36.6 C), temperature source Oral, resp. rate 16, last menstrual period 02/27/2022, SpO2 99 %. There is no height or  weight on file to calculate BMI.  Treatment Plan Summary: Daily contact with patient to assess and evaluate symptoms and progress in treatment and Medication management   Observation Level/Precautions:  15 minute checks  Laboratory:  Labs reviewed   Psychotherapy:  Unit Group sessions  Medications:  See [facility]  Consultations:  To be determined   Discharge Concerns:  Safety, medication compliance, mood stability  Estimated LOS: 5-7 days  Other:  N/A    Labs reviewed on 03/30/2022: CMP WNL, CBC mostly WNL, with hemoglobin slightly low at 11 and hematocrit slightly low at 35.7.  Orders placed for HA1C, lipid panel, TSH, vitamin B12, vitamin D ordered and not completed because pt refused. Reordered these lab, and pt educated on the importance of complying with getting blood drawn for labs. baseline urinalysis shows UTI, treating with Macrobid.  EKG with normal sinus rhythm and QTc within normal limits at 382.   PLAN Safety and Monitoring: Voluntary admission to inpatient psychiatric unit for safety, stabilization and treatment Daily contact with patient to assess and evaluate symptoms and progress in treatment Patient's case to be discussed in multi-disciplinary team meeting Observation Level : q15 minute  checks Vital signs: q12 hours Precautions: Safety   Long Term Goal(s): Improvement in symptoms so as ready for discharge   Short Term Goals: Ability to identify changes in lifestyle to reduce recurrence of condition will improve, Ability to disclose and discuss suicidal ideas, Ability to demonstrate self-control will improve, Ability to identify and develop effective coping behaviors will improve, Ability to maintain clinical measurements within normal limits will improve, Compliance with prescribed medications will improve, and Ability to identify triggers associated with substance abuse/mental health issues will improve   Diagnoses:  Principal Problem:   MDD (major depressive disorder), single episode, severe , no psychosis ([place]) Active Problems:   Seizures ([place])   Asthma   Generalized anxiety disorder with panic attacks   PTSD (post-traumatic stress disorder)   Insomnia   Medications - Klonopin 0.25 mg x 2 doses for GAD, then stop (ended 11/03 @ 0800.   -Continue Macrobid 100 mg twice daily x 7 days for UTI (ends on 11/08)  -Increase Zoloft to 75 mg today, and increase to 100 mg on 11/4 for MDD  -Start Neosporin BID for L wrist wound   -Continue prazosin 1 mg nightly for PTSD   -Continue trazodone 50 mg nightly as needed for sleep   -Continue Nicorette gum as needed for nicotine dependence   -Discontinued agitation protocol: Haldol/Ativan/Benadryl d/t sedation & lightheadedness    -Continue albuterol inhaler as needed every 6 hours for wheezing/shortness of breath r/t asthma    -Continue hydroxyzine 25 mg every 6 hours PRN for anxiety   -Given tetanus vaccine once for tetanus prophylaxis on 03/29/2022   Other PRNS -Continue ibuprofen 400 mg as needed every 6 hours for moderate pain -Continue Tylenol 650 mg every 6 hours PRN for mild pain -Continue Maalox 30 mg every 4 hrs PRN for indigestion -Continue Milk of Magnesia as needed every 6 hrs for constipation   Patient  educated on rationales, benefits, and possible side effects of all medications as listed above, verbalizes understanding, and is agreeable to trials.   Discharge Planning: Social work and case management to assist with discharge planning and identification of hospital follow-up needs prior to discharge Estimated LOS: 5-7 days Discharge Concerns: Need to establish a safety plan; Medication compliance and effectiveness Discharge Goals: Return home with outpatient referrals for mental health follow-up including medication  management/psychotherapy   I certify that inpatient services furnished can reasonably be expected to improve the patient's condition.     Starleen Blue, NP 03/31/2022, 2:46 PMPatient ID: Claudette Head, female   DOB: Jun 02, 1985, 36 y.o.   MRN: [id number]

## 2022-03-31 NOTE — Progress Notes (Addendum)
Pt on unit this morning. Pt medication compliant. Pt denies suicidal thoughts. Dressing changed to left wrist. Pt removed dressing shortly after. Pt frequently redirected for touching wound, educated about risks of infection. Q 15 minute checks ongoing for safety.

## 2022-03-31 NOTE — Group Note (Signed)
Date:  03/31/2022 Time:  9:35 AM  Group Topic/Focus:  Orientation:   The focus of this group is to educate the patient on the purpose and policies of crisis stabilization and provide a format to answer questions about their admission.  The group details unit policies and expectations of patients while admitted.    Participation Level:  Active  Participation Quality:  Appropriate  Affect:  Appropriate  Cognitive:  Appropriate  Insight: Appropriate  Engagement in Group:  Engaged  Modes of Intervention:  Discussion   Additional Comments:     Jerrye Beavers 03/31/2022, 9:35 AM

## 2022-03-31 NOTE — Progress Notes (Signed)
Chaplain engaged Caitlin [name] in a follow up visit.  She was still in good spirits since our last visit on Wednesday and felt grateful that she had learned so much while she has been here.  She shared about some important boundaries she needs to set and we talked through each of them.  She also shared that she was ready to let people go out of her life if they did not respect her boundaries and is aware that even without them, she will still be whole and well. She has been reading and doing art to express her journey of healing.  She feels ready to be the kind of mother that she wants to be and she plans to advocate for mental well-being for all of those in her life.  Chaplain provided listening and support as she shared about her healing.  [address], [place] Pager, [phone]

## 2022-03-31 NOTE — Progress Notes (Signed)
   03/30/22 2115  Psych Admission Type (Psych Patients Only)  Admission Status Involuntary  Psychosocial Assessment  Patient Complaints Depression;Anxiety;Worrying  Eye Contact Fair  Facial Expression Anxious;Worried  Affect Anxious  Catering manager Activity Slow  Appearance/Hygiene Unremarkable  Behavior Characteristics Anxious  Mood Anxious  Thought Process  Coherency WDL  Content WDL  Delusions None reported or observed  Perception WDL  Hallucination None reported or observed  Judgment Poor  Confusion None  Danger to Self  Current suicidal ideation? Denies  Danger to Others  Danger to Others None reported or observed

## 2022-03-31 NOTE — Group Note (Signed)
Recreation Therapy Group Note   Group Topic:Team Building  Group Date: 03/31/2022 Start Time: 0930 End Time: 0950 Facilitators: Victor Granados-McCall, LRT,CTRS Location: [address] Day[address]   Goal Area(s) Addresses:  Patient will effectively work with peer towards shared goal.  Patient will identify skills used to make activity successful.  Patient will identify how skills used during activity can be applied to reach post d/c goals.   Group Description: [employer]. In teams of 5-6, patients were given 15 craft pipe cleaners. Using the materials provided, patients were instructed to compete again the opposing team(s) to build the tallest free-standing structure from floor level. The activity was timed; difficulty increased by Probation officer as Pharmacist, hospital continued.  Systematically resources were removed with additional directions for example, placing one arm behind their back, working in silence, and shape stipulations. LRT facilitated post-activity discussion reviewing team processes and necessary communication skills involved in completion. Patients were encouraged to reflect how the skills utilized, or not utilized, in this activity can be incorporated to positively impact support systems post discharge.   Affect/Mood: Appropriate   Participation Level: Engaged   Participation Quality: Independent   Behavior: Appropriate   Speech/Thought Process: Focused   Insight: Good   Judgement: Good   Modes of Intervention: STEM Activity   Patient Response to Interventions:  Engaged   Education Outcome:  Acknowledges education and In group clarification offered    Clinical Observations/Individualized Feedback: Pt arrived late to group but joined right in with the activity.  Pt was bright and appropriate throughout group session.   Plan: Continue to engage patient in RT group sessions 2-3x/week.   Caitlin [name], LRT,CTRS  03/31/2022 11:39 AM

## 2022-04-01 DIAGNOSIS — F322 Major depressive disorder, single episode, severe without psychotic features: Secondary | ICD-10-CM | POA: Diagnosis not present

## 2022-04-01 MED ORDER — IBUPROFEN 400 MG PO TABS: 400.0000 mg | ORAL_TABLET | Freq: Four times a day (QID) | ORAL | 0 refills | Status: DC | PRN

## 2022-04-01 MED ORDER — VITAMIN D (ERGOCALCIFEROL) 1.25 MG (50000 UNIT) PO CAPS: 50000.0000 [IU] | ORAL_CAPSULE | ORAL | 0 refills | Status: DC

## 2022-04-01 MED ORDER — BACITRACIN ZINC 500 UNIT/GM EX OINT: TOPICAL_OINTMENT | Freq: Two times a day (BID) | CUTANEOUS | 0 refills | Status: DC

## 2022-04-01 MED ORDER — NICOTINE 14 MG/24HR TD PT24: 14.0000 mg | MEDICATED_PATCH | TRANSDERMAL | 0 refills | Status: AC

## 2022-04-01 MED ORDER — SERTRALINE HCL 100 MG PO TABS: 100.0000 mg | ORAL_TABLET | Freq: Every day | ORAL | 0 refills | Status: DC

## 2022-04-01 MED ORDER — VITAMIN D (ERGOCALCIFEROL) 1.25 MG (50000 UNIT) PO CAPS
50000.0000 [IU] | ORAL_CAPSULE | ORAL | Status: AC
  Administered 2022-04-01: 50000 [IU] via ORAL
  Filled 2022-04-01 (×2): qty 1

## 2022-04-01 MED ORDER — PRAZOSIN HCL 1 MG PO CAPS: 1.0000 mg | ORAL_CAPSULE | Freq: Every day | ORAL | 0 refills | Status: DC

## 2022-04-01 MED ORDER — NITROFURANTOIN MONOHYD MACRO 100 MG PO CAPS: 100.0000 mg | ORAL_CAPSULE | Freq: Two times a day (BID) | ORAL | 0 refills | Status: DC

## 2022-04-01 MED ORDER — HYDROXYZINE HCL 25 MG PO TABS: 25.0000 mg | ORAL_TABLET | Freq: Three times a day (TID) | ORAL | 0 refills | Status: DC | PRN

## 2022-04-01 MED ORDER — ALBUTEROL SULFATE HFA 108 (90 BASE) MCG/ACT IN AERS: 1.0000 | INHALATION_SPRAY | Freq: Four times a day (QID) | RESPIRATORY_TRACT | 0 refills | Status: AC | PRN

## 2022-04-01 MED ORDER — TRAZODONE HCL 50 MG PO TABS: 50.0000 mg | ORAL_TABLET | Freq: Every evening | ORAL | 0 refills | Status: DC | PRN

## 2022-04-01 NOTE — Progress Notes (Signed)
Discharge:  Patient discharged home with husband.  Suicide prevention information given and discussed with patient who stated she understood and had no questions.  Denied SI and HI.  Denied A/V hallucinations.  Patient stated she received all her belongings, clothing, toiletries, misc items, etc.  Patient stated she appreciated all assistance received from [facility] staff.  All required discharge information given.

## 2022-04-01 NOTE — Group Note (Signed)
[facility] Group Notes: (Clinical Social Work)   04/01/2022      Type of Therapy:  Group Therapy   Participation Level:  Did Not Attend - was invited both individually by MHT and by overhead announcement, chose not to attend.  She did come into the room to say goodbye to her peers prior to discharging during group.   Selmer Dominion, LCSW 04/01/2022, 1:40 PM

## 2022-04-01 NOTE — BHH Group Notes (Signed)
  Goals Group 04/01/22   Group Focus: affirmation, clarity of thought, and goals/reality orientation Treatment Modality:  Psychoeducation Interventions utilized were assignment, group exercise, and support Purpose: To be able to understand and verbalize the reason for their admission to the hospital. To understand that the medication helps with their chemical imbalance but they also need to work on their choices in life. To be challenged to develop a list of 30 positives about themselves. Also introduce the concept that "feelings" are not reality.  Participation Level:  Active  Participation Quality:  Appropriate  Affect:  Appropriate  Cognitive:  Appropriate  Insight:  Improving  Engagement in Group:  Engaged  Additional Comments:  Rates her energy at an 8.5/10. Participated fully in the group.  Caitlin [name]

## 2022-04-01 NOTE — Progress Notes (Signed)
  [facility] Adult Case Management Discharge Plan :  Will you be returning to the same living situation after discharge:  Yes,  with family At discharge, do you have transportation home?: Yes,  family Do you have the ability to pay for your medications: No.  Needs help from community agency  Release of information consent forms completed and emailed to Medical Records, then turned in to Medical Records by CSW.   Patient to Follow up at:  [facility]. Go on 04/07/2022.   Specialty: Behavioral Health Why: You have an appointment with Dr. Damita Dunnings on 04/07/22 at  9 am for medication management services.  You also have an appointment with   [facility] on 04/18/22 @ 9 am for therapy services.  These appointments will be held in person. Contact information: [place] [phone]                Next level of care provider has access to [place] and Suicide Prevention discussed: Yes,  with partner     Has patient been referred to the Quitline?: Patient refused referral  Patient has been referred for addiction treatment: Yes  Maretta Los, LCSW 04/01/2022, 10:05 AM

## 2022-04-01 NOTE — Progress Notes (Cosign Needed)
PMHNP student called pt to inform pt that there are medication samples that were inadvertently not given to pt prior to discharge today.  PMHNP student left pt HIPAA compliant voicemail asking pt to return the call.
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
## 2022-04-01 NOTE — Plan of Care (Signed)
Nurse discussed coping skills with patient.  

## 2022-04-01 NOTE — Discharge Summary (Signed)
Physician Discharge Summary Note  Patient:  Caitlin [name] is an 36 y.o., female MRN:  [id number] DOB:  Oct 19, 1985 Patient phone:  [phone] (home)  Patient address:   [address] [place] [id number],  Total Time spent with patient: 30 minutes  Date of Admission:  03/28/2022 Date of Discharge: 04/01/2022  Reason for Admission: Caitlin [name] is a 36 yo [place] American female with no prior mental health history who was taken by EMS to the [facility] ER after using a box cutter to cut herself in the left wrist with an intent to end her life. Pt was transferred to this [facility] for treatment and stabilization of her mood.    Principal Problem: MDD (major depressive disorder), single episode, severe , no psychosis ([place]) Discharge Diagnoses: Principal Problem:   MDD (major depressive disorder), single episode, severe , no psychosis ([place]) Active Problems:   Asthma   Generalized anxiety disorder with panic attacks   PTSD (post-traumatic stress disorder)   Insomnia   UTI (urinary tract infection)  Past Psychiatric History: No prior mental health conditions  Past Medical History:  Past Medical History:  Diagnosis Date   Asthma    Seizures ([place])     Past Surgical History:  Procedure Laterality Date   CESAREAN SECTION     CHOLECYSTECTOMY     TUBAL LIGATION     Family History:  Family History  Problem Relation Age of Onset   Epilepsy Father    Family Psychiatric  History: See H & P Social History:  Social History   Substance and Sexual Activity  Alcohol Use Yes   Comment: occasional     Social History   Substance and Sexual Activity  Drug Use Yes   Types: Marijuana   Comment: Daily, stopped 02/2019    Social History   Socioeconomic History   Marital status: Single    Spouse name: Not on file   Number of children: Not on file   Years of education: Not on file   Highest education level: Not on file  Occupational History   Not on file  Tobacco Use    Smoking status: Some Days    Packs/day: 0.25    Types: Cigarettes   Smokeless tobacco: Not on file   Tobacco comments:    2-3 per day   Substance and Sexual Activity   Alcohol use: Yes    Comment: occasional   Drug use: Yes    Types: Marijuana    Comment: Daily, stopped 02/2019   Sexual activity: Yes    Partners: Male    Birth control/protection: None  Other Topics Concern   Not on file  Social History Narrative   Not on file   Social Determinants of Health   Financial Resource Strain: Not on file  Food Insecurity: Food Insecurity Present (03/28/2022)   Hunger Vital Sign    Worried About Running Out of Food in the Last Year: Often true    Ran Out of Food in the Last Year: Often true  Transportation Needs: Unmet Transportation Needs (03/28/2022)   PRAPARE - Hydrologist (Medical): No    Lack of Transportation (Non-Medical): Yes  Physical Activity: Not on file  Stress: Not on file  Social Connections: Not on file  HOSPITAL COURSE During the patient's hospitalization, patient had extensive initial psychiatric evaluation, and follow-up psychiatric evaluations every day.  Diagnoses provided upon initial assessment are:  Principal Problem:   MDD (major depressive disorder), single episode, severe , no psychosis ([place]) Active Problems:   Asthma   Generalized anxiety disorder with panic attacks   PTSD (post-traumatic stress disorder)   Insomnia   UTI (urinary tract infection) Patient's psychiatric medications were adjusted on admission as follows: -Gave Klonopin 0.25 mg x 2 doses for GAD, then stop -Started Zoloft 25 mg for MDD -Started prazosin 1 mg nightly for PTSD -Started trazodone 50 mg nightly as needed for sleep -Started Nicorette gum as needed for nicotine dependence -Started agitation protocol: Haldol/Ativan/Benadryl as needed for agitation  -Started albuterol inhaler as needed every 6 hours  for wheezing/shortness of breath r/t asthma -Started hydroxyzine 25 mg every 6 hours PRN for anxiety  -Gave tetanus vaccine once for tetanus prophylaxis (wound to L wrist)   During the hospitalization, other adjustments were made to the patient's psychiatric medication regimen. Medications at discharge and to be continued after discharge are as follows:   -Continue Macrobid 100 mg twice daily x 7 days for UTI (ends on 11/08) -Continue Zoloft 100 mg for MDD -Continue Neosporin BID for L wrist woundprophylaxis  -Continue prazosin 1 mg nightly for PTSD  -Continue trazodone 50 mg nightly as needed for sleep  -Continue Nicotine patch as dailyfor nicotine dependence  -Continue albuterol inhaler as needed every 6 hours for   wheezing/shortness of breath r/t asthma  -Continue hydroxyzine 25 mg every 6 hours PRN for anxiety     Patient's care was discussed during the interdisciplinary team meeting every day during the hospitalization. The patient denies having side effects to prescribed psychiatric medication. Gradually, patient started adjusting to milieu. The patient was evaluated each day by a clinical provider to ascertain response to treatment. Improvement was noted by the patient's report of decreasing symptoms, improved sleep and appetite, affect, medication tolerance, behavior, and participation in unit programming.  Patient was asked each day to complete a self inventory noting mood, mental status, pain, new symptoms, anxiety and concerns.     Symptoms were reported as significantly decreased or resolved completely by discharge.    On day of discharge, the patient reports that their mood is stable. The patient denied having suicidal thoughts for more than 48 hours prior to discharge.  Patient denies having homicidal thoughts.  Patient denies having auditory hallucinations.  Patient denies any visual hallucinations or other symptoms of psychosis. The patient was motivated to continue taking  medication with a goal of continued improvement in mental health.    The patient reports their target psychiatric symptoms of depression, anxiety and insomnia responded well to the psychiatric medications, and the patient reports overall benefit other psychiatric hospitalization. Supportive psychotherapy was provided to the patient. The patient also participated in regular group therapy while hospitalized. Coping skills, problem solving as well as relaxation therapies were also part of the unit programming.   Labs were reviewed with the patient, and abnormal results were discussed with the patient. Vitamin D is low at 5.77. Vitamin D 50,000 units ordered to be taken every 7 days. Pt educated to f/u with her PCP. Pt educated to continue taking her antibiotics for UTI and to f/u with her PCP regarding this.  Physical Findings: AIMS: 0 CIWA:   n/a COWS:  n/a   Musculoskeletal: Strength & Muscle Tone: within normal limits Gait & Station: normal Patient  leans: N/A  Psychiatric Specialty Exam:  Presentation  General Appearance:  Appropriate for Environment; Fairly Groomed  Eye Contact: Good  Speech: Clear and Coherent  Speech Volume: Normal  Handedness: Right  Mood and Affect  Mood: Euthymic  Affect: Appropriate; Congruent  Thought Process  Thought Processes: Coherent  Descriptions of Associations:Intact  Orientation:Full (Time, Place and Person)  Thought Content:Logical  History of Schizophrenia/Schizoaffective disorder:No data recorded Duration of Psychotic Symptoms:No data recorded Hallucinations:Hallucinations: None  Ideas of Reference:None  Suicidal Thoughts:Suicidal Thoughts: No  Homicidal Thoughts:Homicidal Thoughts: No  Sensorium  Memory: Immediate Good  Judgment: Good  Insight: Good  Executive Functions  Concentration: Good  Attention Span: Good  Recall: Good  Fund of Knowledge: Good  Language: Good   Psychomotor Activity   Psychomotor Activity: Psychomotor Activity: Normal  Assets  Assets: Communication Skills; Housing; Resilience  Sleep  Sleep: Sleep: Good  Physical Exam: Physical Exam Constitutional:      Appearance: Normal appearance.  HENT:     Head: Normocephalic.     Nose: Nose normal. No congestion or rhinorrhea.  Eyes:     Pupils: Pupils are equal, round, and reactive to light.  Pulmonary:     Effort: Pulmonary effort is normal. No respiratory distress.  Musculoskeletal:        General: Normal range of motion.     Cervical back: Normal range of motion.  Neurological:     Mental Status: She is alert and oriented to person, place, and time.     Coordination: Coordination normal.    Review of Systems  Constitutional:  Negative for fever.  HENT:  Negative for hearing loss and sore throat.   Respiratory:  Negative for cough.   Cardiovascular:  Negative for chest pain.  Gastrointestinal:  Negative for heartburn.  Genitourinary: Negative.   Musculoskeletal: Negative.   Skin: Negative.  Negative for rash.  Neurological:  Negative for dizziness and headaches.  Psychiatric/Behavioral:  Positive for depression (Denies SI, denies HI, denies AVH, verbally contracts for safety outside of this [facility]) and substance abuse (educated on cessation). Negative for hallucinations, memory loss and suicidal ideas. The patient is nervous/anxious (resolving currently) and has insomnia (resolved on current medications).    Blood pressure 108/88, pulse 73, temperature 98.5 F (36.9 C), temperature source Oral, resp. rate 16, last menstrual period 02/27/2022, SpO2 100 %. There is no height or weight on file to calculate BMI.  Social History   Tobacco Use  Smoking Status Some Days   Packs/day: 0.25   Types: Cigarettes  Smokeless Tobacco Not on file  Tobacco Comments   2-3 per day    Tobacco Cessation:  A prescription for an FDA-approved tobacco cessation medication provided at discharge  Blood Alcohol  level:  Lab Results  Component Value Date   ETH <10 [id number]   Metabolic Disorder Labs:  Lab Results  Component Value Date   HGBA1C 4.9 03/31/2022   MPG 93.93 03/31/2022   No results found for: "PROLACTIN" Lab Results  Component Value Date   CHOL 173 03/31/2022   TRIG 78 03/31/2022   HDL 44 03/31/2022   CHOLHDL 3.9 03/31/2022   VLDL 16 03/31/2022   LDLCALC 113 (H) 03/31/2022   See Psychiatric Specialty Exam and Suicide Risk Assessment completed by Attending Physician prior to discharge.  Discharge destination:  Home  Is patient on multiple antipsychotic therapies at discharge:  No   Has Patient had three or more failed trials of antipsychotic monotherapy by history:  No  Recommended Plan for  Multiple Antipsychotic Therapies: NA  Allergies as of 04/01/2022   No Known Allergies      Medication List     STOP taking these medications    doxylamine (Sleep) 25 MG tablet Commonly known as: UNISOM       TAKE these medications      Indication  albuterol 108 (90 Base) MCG/ACT inhaler Commonly known as: VENTOLIN HFA Inhale 1-2 puffs into the lungs every 6 (six) hours as needed for wheezing or shortness of breath.  Indication: Asthma   bacitracin ointment Apply topically 2 (two) times daily.  Indication: wound infection prophylaxis   hydrOXYzine 25 MG tablet Commonly known as: ATARAX Take 1 tablet (25 mg total) by mouth 3 (three) times daily as needed for anxiety.  Indication: Feeling Anxious   ibuprofen 400 MG tablet Commonly known as: ADVIL Take 1 tablet (400 mg total) by mouth every 6 (six) hours as needed for moderate pain.  Indication: Pain, pain   nicotine 14 mg/24hr patch Commonly known as: NICODERM CQ - dosed in mg/24 hours Place 1 patch (14 mg total) onto the skin daily.  Indication: Nicotine Addiction   nitrofurantoin (macrocrystal-monohydrate) 100 MG capsule Commonly known as: MACROBID Take 1 capsule (100 mg total) by mouth every 12 (twelve)  hours.  Indication: Simple Infection of the Urinary Tract   prazosin 1 MG capsule Commonly known as: MINIPRESS Take 1 capsule (1 mg total) by mouth at bedtime.  Indication: Frightening Dreams   sertraline 100 MG tablet Commonly known as: ZOLOFT Take 1 tablet (100 mg total) by mouth daily. Start taking on: April 02, 2022  Indication: Major Depressive Disorder   traZODone 50 MG tablet Commonly known as: DESYREL Take 1 tablet (50 mg total) by mouth at bedtime as needed for sleep.  Indication: Trouble Sleeping   Vitamin D (Ergocalciferol) 1.25 MG (50000 UNIT) Caps capsule Commonly known as: DRISDOL Take 1 capsule (50,000 Units total) by mouth every 7 (seven) days.  Indication: Vitamin D Deficiency        Follow-up Information     [place]. Go on 04/07/2022.   Specialty: Behavioral Health Why: You have an appointment with Dr. Damita Dunnings on 04/07/22 at  9 am for medication management services.  You also have an appointment with   Rockey Situ on 04/18/22 @ 9 am for therapy services.  These appointments will be held in person. Contact information: [place] [phone]               Follow-up recommendations:   The patient is able to verbalize their individual safety plan to this provider.   # It is recommended to the patient to continue psychiatric medications as prescribed, after discharge from the hospital.     # It is recommended to the patient to follow up with your outpatient psychiatric provider and PCP.   # It was discussed with the patient, the impact of alcohol, drugs, tobacco have been there overall psychiatric and medical wellbeing, and total abstinence from substance use was recommended the patient.ed.   # Prescriptions provided or sent directly to preferred pharmacy at discharge. Patient agreeable to plan. Given opportunity to ask questions. Appears to feel comfortable with discharge.     # In the event of worsening symptoms, the patient is instructed to call the crisis hotline (988), 911 and or go to the nearest ED for appropriate evaluation and treatment of symptoms. To follow-up with primary care provider for other  medical issues, concerns and or health care needs   # Patient was discharged home with a plan to follow up as noted above.   Signed: Nicholes Rough, NP 04/01/2022, 11:10 AM

## 2022-04-07 ENCOUNTER — Ambulatory Visit (INDEPENDENT_AMBULATORY_CARE_PROVIDER_SITE_OTHER): Payer: No Payment, Other | Admitting: Student in an Organized Health Care Education/Training Program

## 2022-04-07 ENCOUNTER — Encounter (HOSPITAL_COMMUNITY): Payer: Self-pay | Admitting: Student in an Organized Health Care Education/Training Program

## 2022-04-07 VITALS — BP 103/88 | HR 62 | Wt 180.0 lb

## 2022-04-07 DIAGNOSIS — F322 Major depressive disorder, single episode, severe without psychotic features: Secondary | ICD-10-CM

## 2022-04-07 DIAGNOSIS — F4310 Post-traumatic stress disorder, unspecified: Secondary | ICD-10-CM

## 2022-04-07 DIAGNOSIS — G4700 Insomnia, unspecified: Secondary | ICD-10-CM | POA: Diagnosis not present

## 2022-04-07 MED ORDER — TRAZODONE HCL 50 MG PO TABS: 50.0000 mg | ORAL_TABLET | Freq: Every evening | ORAL | 2 refills | Status: DC | PRN

## 2022-04-07 MED ORDER — PRAZOSIN HCL 1 MG PO CAPS: 1.0000 mg | ORAL_CAPSULE | Freq: Every day | ORAL | 2 refills | Status: DC

## 2022-04-07 MED ORDER — SERTRALINE HCL 50 MG PO TABS: 150.0000 mg | ORAL_TABLET | Freq: Every day | ORAL | 2 refills | Status: DC

## 2022-04-07 MED ORDER — HYDROXYZINE HCL 25 MG PO TABS: 25.0000 mg | ORAL_TABLET | Freq: Three times a day (TID) | ORAL | 2 refills | Status: DC | PRN
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
## 2022-04-10 ENCOUNTER — Ambulatory Visit (HOSPITAL_COMMUNITY): Payer: Federal, State, Local not specified - Other | Admitting: Psychiatry

## 2022-04-18 ENCOUNTER — Ambulatory Visit (HOSPITAL_COMMUNITY): Payer: Federal, State, Local not specified - Other | Admitting: Mental Health

## 2022-05-03 ENCOUNTER — Telehealth (HOSPITAL_COMMUNITY): Payer: Self-pay | Admitting: *Deleted

## 2022-05-03 NOTE — Telephone Encounter (Signed)
Rx Refill Request [facility] [phone] - [place], [place] - [address]  prazosin (MINIPRESS) 1 MG capsule

## 2022-05-04 ENCOUNTER — Other Ambulatory Visit (HOSPITAL_COMMUNITY): Payer: Self-pay | Admitting: Student in an Organized Health Care Education/Training Program

## 2022-05-04 DIAGNOSIS — F4310 Post-traumatic stress disorder, unspecified: Secondary | ICD-10-CM

## 2022-05-04 MED ORDER — PRAZOSIN HCL 1 MG PO CAPS: 1.0000 mg | ORAL_CAPSULE | Freq: Every day | ORAL | 2 refills | Status: DC

## 2022-05-04 NOTE — Telephone Encounter (Signed)
This patient also has refills ordered, but did again anyway.

## 2022-05-09 ENCOUNTER — Encounter (HOSPITAL_COMMUNITY)
Payer: Federal, State, Local not specified - Other | Admitting: Student in an Organized Health Care Education/Training Program

## 2023-03-26 ENCOUNTER — Encounter (HOSPITAL_COMMUNITY): Payer: Self-pay

## 2023-03-26 ENCOUNTER — Ambulatory Visit (HOSPITAL_COMMUNITY)
Admission: EM | Admit: 2023-03-26 | Discharge: 2023-03-26 | Disposition: A | Discharge status: Home / Self Care | Payer: BLUE CROSS/BLUE SHIELD | Attending: Family Medicine | Admitting: Family Medicine

## 2023-03-26 ENCOUNTER — Emergency Department (HOSPITAL_COMMUNITY)
Admission: EM | Admit: 2023-03-26 | Discharge: 2023-03-26 | Disposition: A | Discharge status: Home / Self Care | Payer: BLUE CROSS/BLUE SHIELD | Attending: Emergency Medicine | Admitting: Emergency Medicine

## 2023-03-26 ENCOUNTER — Emergency Department (HOSPITAL_COMMUNITY): Payer: BLUE CROSS/BLUE SHIELD

## 2023-03-26 ENCOUNTER — Encounter (HOSPITAL_COMMUNITY): Payer: Self-pay | Admitting: Emergency Medicine

## 2023-03-26 DIAGNOSIS — M79672 Pain in left foot: Secondary | ICD-10-CM | POA: Insufficient documentation

## 2023-03-26 DIAGNOSIS — R2242 Localized swelling, mass and lump, left lower limb: Secondary | ICD-10-CM

## 2023-03-26 DIAGNOSIS — M79671 Pain in right foot: Secondary | ICD-10-CM

## 2023-03-26 MED ORDER — MELOXICAM 7.5 MG PO TABS: 7.5000 mg | ORAL_TABLET | Freq: Every day | ORAL | 0 refills | Status: DC

## 2023-03-26 MED ORDER — IBUPROFEN 800 MG PO TABS
800.0000 mg | ORAL_TABLET | Freq: Once | ORAL | Status: AC
  Administered 2023-03-26: 800 mg via ORAL
  Filled 2023-03-26: qty 1

## 2023-03-26 NOTE — ED Provider Notes (Signed)
[facility]    CSN: [id number] Arrival date & time: 03/26/23  0841      History   Chief Complaint Chief Complaint  Patient presents with   Foot Pain    HPI Caitlin [name] is a 37 y.o. female.    Foot Pain  Here with left heel pain that began about 2 weeks ago.  At first the area of tenderness and swelling was very small but it is enlarged to cover most of her heel on the plantar surface.  First she noticed a little clear drainage from 1 spot.  She has also noticed some discoloration over her lateral malleolus that is a little erythematous.  No fever or chills.  The pain has become exquisite and radiates down to her pinky toe and up her leg.  No allergies to medications  Last menstrual cycle was October 17    Past Medical History:  Diagnosis Date   Asthma    Seizures [facility])     Patient Active Problem List   Diagnosis Date Noted   UTI (urinary tract infection) 03/30/2022   Generalized anxiety disorder with panic attacks 03/29/2022   MDD (major depressive disorder), single episode, severe , no psychosis (HCC) 03/29/2022   PTSD (post-traumatic stress disorder) 03/29/2022   Insomnia 03/29/2022   Suicidal ideation 03/28/2022   Asthma 04/03/2019    Past Surgical History:  Procedure Laterality Date   CESAREAN SECTION     CHOLECYSTECTOMY     TUBAL LIGATION      OB History   No obstetric history on file.      Home Medications    Prior to Admission medications   Medication Sig Start Date End Date Taking? Authorizing Provider  hydrOXYzine (ATARAX) 25 MG tablet Take 1 tablet (25 mg total) by mouth 3 (three) times daily as needed for anxiety. 04/07/22  Yes Bobbye Morton, MD  sertraline (ZOLOFT) 100 MG tablet Take 1 tablet (100 mg total) by mouth daily. 04/02/22  Yes Starleen Blue, NP  albuterol (VENTOLIN HFA) 108 (90 Base) MCG/ACT inhaler Inhale 1-2 puffs into the lungs every 6 (six) hours as needed for wheezing or shortness of breath. 04/01/22    Starleen Blue, NP  bacitracin ointment Apply topically 2 (two) times daily. 04/01/22   Starleen Blue, NP  ibuprofen (ADVIL) 400 MG tablet Take 1 tablet (400 mg total) by mouth every 6 (six) hours as needed for moderate pain. 04/01/22   Nkwenti, Tyler Aas, NP  nicotine (NICODERM CQ - DOSED IN MG/24 HOURS) 14 mg/24hr patch Place 1 patch (14 mg total) onto the skin daily. 04/01/22 04/01/23  Starleen Blue, NP  nitrofurantoin, macrocrystal-monohydrate, (MACROBID) 100 MG capsule Take 1 capsule (100 mg total) by mouth every 12 (twelve) hours. 04/01/22   Starleen Blue, NP  prazosin (MINIPRESS) 1 MG capsule Take 1 capsule (1 mg total) by mouth at bedtime. 05/04/22   Bobbye Morton, MD  sertraline (ZOLOFT) 50 MG tablet Take 3 tablets (150 mg total) by mouth daily. 04/07/22 04/07/23  Bobbye Morton, MD  traZODone (DESYREL) 50 MG tablet Take 1 tablet (50 mg total) by mouth at bedtime as needed for sleep. 04/07/22   Bobbye Morton, MD  Vitamin D, Ergocalciferol, (DRISDOL) 1.25 MG (50000 UNIT) CAPS capsule Take 1 capsule (50,000 Units total) by mouth every 7 (seven) days. 04/01/22   Starleen Blue, NP    Family History Family History  Problem Relation Age of Onset   Epilepsy Father     Social History  Social History   Tobacco Use   Smoking status: Some Days    Current packs/day: 0.25    Types: Cigarettes   Tobacco comments:    2-3 per day   Substance Use Topics   Alcohol use: Yes    Comment: occasional   Drug use: Yes    Types: Marijuana    Comment: Daily, stopped 02/2019     Allergies   Patient has no known allergies.   Review of Systems Review of Systems   Physical Exam Triage Vital Signs ED Triage Vitals  Encounter Vitals Group     BP 03/26/23 0925 128/63     Systolic BP Percentile --      Diastolic BP Percentile --      Pulse Rate 03/26/23 0925 67     Resp 03/26/23 0925 18     Temp 03/26/23 0925 98.3 F (36.8 C)     Temp Source 03/26/23 0925 Oral     SpO2 03/26/23 0925 98 %      Weight --      Height --      Head Circumference --      Peak Flow --      Pain Score 03/26/23 0929 6     Pain Loc --      Pain Education --      Exclude from Growth Chart --    No data found.  Updated Vital Signs BP 128/63   Pulse 67   Temp 98.3 F (36.8 C) (Oral)   Resp 18   LMP 03/15/2023   SpO2 98%   Visual Acuity Right Eye Distance:   Left Eye Distance:   Bilateral Distance:    Right Eye Near:   Left Eye Near:    Bilateral Near:     Physical Exam Vitals reviewed.  Constitutional:      General: She is not in acute distress.    Appearance: She is not ill-appearing, toxic-appearing or diaphoretic.  HENT:     Mouth/Throat:     Mouth: Mucous membranes are moist.  Musculoskeletal:     Comments: There is swelling and exquisite tenderness over the heel on the plantar surface.  There is 1 tiny area that may have been a draining spot laterally.  There is also some swelling and tenderness over the lateral malleolus with some dark erythema there.  Skin:    Coloration: Skin is not pale.  Neurological:     General: No focal deficit present.     Mental Status: She is alert and oriented to person, place, and time.  Psychiatric:        Behavior: Behavior normal.      UC Treatments / Results  Labs (all labs ordered are listed, but only abnormal results are displayed) Labs Reviewed - No data to display  EKG   Radiology No results found.  Procedures Procedures (including critical care time)  Medications Ordered in UC Medications - No data to display  Initial Impression / Assessment and Plan / UC Course  I have reviewed the triage vital signs and the nursing notes.  Pertinent labs & imaging results that were available during my care of the patient were reviewed by me and considered in my medical decision making (see chart for details).     With her being so exquisitely tender and the swelling that seems to be deeper than superficial cellulitis or abscess, I  have asked her to proceed to the emergency room for higher level of evaluation and  treatment then we can provide here in the urgent care setting. Final Clinical Impressions(s) / UC Diagnoses   Final diagnoses:  Foot pain, left  Localized swelling of left foot     Discharge Instructions      Patient will proceed to the emergency room for further evaluation     ED Prescriptions   None    PDMP not reviewed this encounter.   Zenia Resides, MD 03/26/23 [phone]

## 2023-03-26 NOTE — ED Triage Notes (Signed)
Pt here from UC with pain to her left heel ., pt has a small spot on her heel that is very painful to touch

## 2023-03-26 NOTE — ED Provider Notes (Signed)
[place] [facility]ERGENCY DEPARTMENT AT [facility] Provider Note   CSN: [id number] Arrival date & time: 03/26/23  1022     History  Chief Complaint  Patient presents with   Foot Pain    Caitlin [name] is a 37 y.o. female.  Patient presents emergency department for several weeks of left heel pain.  Initially she is not sure if she stepped on something or injured in any way.  She states some clear drainage initially.  She has had the area become more swollen and hard over the past couple of weeks.  No active drainage.  Area is extremely tender to palpation.  She was sent from urgent care for further evaluation due to exquisite tenderness.  She denies fevers.     Home Medications Prior to Admission medications   Medication Sig Start Date End Date Taking? Authorizing Provider  albuterol (VENTOLIN HFA) 108 (90 Base) MCG/ACT inhaler Inhale 1-2 puffs into the lungs every 6 (six) hours as needed for wheezing or shortness of breath. 04/01/22   Starleen Blue, NP  bacitracin ointment Apply topically 2 (two) times daily. 04/01/22   Starleen Blue, NP  hydrOXYzine (ATARAX) 25 MG tablet Take 1 tablet (25 mg total) by mouth 3 (three) times daily as needed for anxiety. 04/07/22   Bobbye Morton, MD  ibuprofen (ADVIL) 400 MG tablet Take 1 tablet (400 mg total) by mouth every 6 (six) hours as needed for moderate pain. 04/01/22   Nkwenti, Tyler Aas, NP  nicotine (NICODERM CQ - DOSED IN MG/24 HOURS) 14 mg/24hr patch Place 1 patch (14 mg total) onto the skin daily. 04/01/22 04/01/23  Starleen Blue, NP  nitrofurantoin, macrocrystal-monohydrate, (MACROBID) 100 MG capsule Take 1 capsule (100 mg total) by mouth every 12 (twelve) hours. 04/01/22   Starleen Blue, NP  prazosin (MINIPRESS) 1 MG capsule Take 1 capsule (1 mg total) by mouth at bedtime. 05/04/22   Bobbye Morton, MD  sertraline (ZOLOFT) 100 MG tablet Take 1 tablet (100 mg total) by mouth daily. 04/02/22   Starleen Blue, NP  sertraline (ZOLOFT) 50  MG tablet Take 3 tablets (150 mg total) by mouth daily. 04/07/22 04/07/23  Bobbye Morton, MD  traZODone (DESYREL) 50 MG tablet Take 1 tablet (50 mg total) by mouth at bedtime as needed for sleep. 04/07/22   Bobbye Morton, MD  Vitamin D, Ergocalciferol, (DRISDOL) 1.25 MG (50000 UNIT) CAPS capsule Take 1 capsule (50,000 Units total) by mouth every 7 (seven) days. 04/01/22   Starleen Blue, NP      Allergies    Patient has no known allergies.    Review of Systems   Review of Systems  Physical Exam Updated Vital Signs BP (!) 134/91   Pulse 71   Temp 98.3 F (36.8 C) (Oral)   Resp 16   LMP 03/13/2023   SpO2 100%   Physical Exam Vitals and nursing note reviewed.  Constitutional:      Appearance: She is well-developed.  HENT:     Head: Normocephalic and atraumatic.  Eyes:     Pupils: Pupils are equal, round, and reactive to light.  Cardiovascular:     Pulses: Normal pulses. No decreased pulses.  Musculoskeletal:        General: Tenderness present.     Cervical back: Normal range of motion and neck supple.     Left foot: Swelling and tenderness present.     Comments: Patient with generalized swelling of the heel and lateral foot, no active drainage.  No overlying erythema.  Patient winces and yells with even light palpation in this area.  Skin:    General: Skin is warm and dry.  Neurological:     Mental Status: She is alert.     Sensory: No sensory deficit.     Comments: Motor, sensation, and vascular distal to the injury is fully intact.   Psychiatric:        Mood and Affect: Mood normal.    ED Results / Procedures / Treatments   Labs (all labs ordered are listed, but only abnormal results are displayed) Labs Reviewed - No data to display  EKG None  Radiology DG Foot Complete Left  Result Date: 03/26/2023 CLINICAL DATA:  Left heel pain for 2 weeks. 3 days ago patient stood up and had immense pain and shooting and left fifth toe and up lateral side of lower leg.  EXAM: LEFT FOOT - COMPLETE 3+ VIEW COMPARISON:  None Available. FINDINGS: Mild-to-moderate plantar and posterior calcaneal heel spurs. Normal bone mineralization. Joint spaces are preserved. No acute fracture or dislocation. IMPRESSION: Mild-to-moderate plantar and posterior calcaneal heel spurs. Electronically Signed   By: Neita Garnet M.D.   On: 03/26/2023 13:01    Procedures Procedures    Medications Ordered in ED Medications  ibuprofen (ADVIL) tablet 800 mg (800 mg Oral Given 03/26/23 1210)    ED Course/ Medical Decision Making/ A&P    Patient seen and examined. History obtained directly from patient.   Labs/EKG: None ordered.  Imaging: Ordered plain film, pending  Medications/Fluids: None ordered, ibuprofen given on arrival.   Most recent vital signs reviewed and are as follows: BP (!) 134/91   Pulse 71   Temp 98.3 F (36.8 C) (Oral)   Resp 16   LMP 03/13/2023   SpO2 100%   Initial impression: Foot pain and swelling, amount of pain appears disproportionate to exam.   1:50 PM Reassessment performed. Patient appears stable.  She has been up to the restroom, but walking on her toes.  Exam remained stable.  No signs of cellulitis.  Imaging personally visualized and interpreted including: X-ray of the foot, agree no signs of foreign body, heel spurs noted.  Reviewed pertinent lab work and imaging with patient at bedside. Questions answered.   Most current vital signs reviewed and are as follows: BP (!) 134/91   Pulse 71   Temp 98.3 F (36.8 C) (Oral)   Resp 16   LMP 03/13/2023   SpO2 100%   Plan: Discharge to home.  We did have a shared decision-making discussion in regards to what to do next.  Discussed that outwardly, she does not have any hard signs of infection or abscess, but cannot entirely rule out.  Offered treatment with NSAIDs, and routine care with orthopedic outpatient follow-up versus additional workup today including lab work and imaging of the foot with  CT scan.  Patient states that she would prefer treatment and forego additional testing at this time.  We did discuss signs and symptoms that should cause her to return and she agrees to do so.  These include increasing redness, pain, swelling on the bottom of the foot, streaking redness, fevers.  Also if she develops any drainage from the foot.  Prescriptions written for: Meloxicam  Other home care instructions discussed: RICE protocol  ED return instructions discussed: As above  Follow-up instructions discussed: Patient encouraged to follow-up with their PCP/Ortho referral in 7 days.  Medical Decision Making Amount and/or Complexity of Data Reviewed Radiology: ordered.  Risk Prescription drug management.   Patient with right foot pain and swelling.  Foot is not warm or red.  There is some swelling.  No foreign bodies noted on x-ray.  No soft tissue gas noted.  No overlying erythema or significant warmth to suggest infection.  Overall low concern for abscess.  Shared decision making discussion had with patient as above.  At this time we will treat conservatively and have her monitor closely.  The patient's vital signs, pertinent lab work and imaging were reviewed and interpreted as discussed in the ED course. Hospitalization was considered for further testing, treatments, or serial exams/observation. However as patient is well-appearing, has a stable exam, and reassuring studies today, I do not feel that they warrant admission at this time. This plan was discussed with the patient who verbalizes agreement and comfort with this plan and seems reliable and able to return to the Emergency Department with worsening or changing symptoms.           Final Clinical Impression(s) / ED Diagnoses Final diagnoses:  Right foot pain    Rx / DC Orders ED Discharge Orders          Ordered    meloxicam (MOBIC) 7.5 MG tablet  Daily        03/26/23 1349               Renne Crigler, PA-C 03/26/23 1353    Horton, Clabe Seal, DO 03/26/23 1606

## 2023-03-26 NOTE — ED Triage Notes (Signed)
Pt presents to the office for left foot bump. Pt thinks she has a boil on her foot. Pain 06/10

## 2023-03-26 NOTE — Discharge Instructions (Addendum)
Patient will proceed to the emergency room for further evaluation. 

## 2023-07-20 ENCOUNTER — Ambulatory Visit (HOSPITAL_COMMUNITY)
Admission: EM | Admit: 2023-07-20 | Discharge: 2023-07-20 | Disposition: A | Discharge status: Home / Self Care | Payer: BLUE CROSS/BLUE SHIELD | Attending: Family Medicine | Admitting: Family Medicine

## 2023-07-20 ENCOUNTER — Ambulatory Visit (INDEPENDENT_AMBULATORY_CARE_PROVIDER_SITE_OTHER): Payer: BLUE CROSS/BLUE SHIELD

## 2023-07-20 ENCOUNTER — Encounter (HOSPITAL_COMMUNITY): Payer: Self-pay

## 2023-07-20 DIAGNOSIS — R200 Anesthesia of skin: Secondary | ICD-10-CM

## 2023-07-20 DIAGNOSIS — M79602 Pain in left arm: Secondary | ICD-10-CM | POA: Diagnosis not present

## 2023-07-20 MED ORDER — METHYLPREDNISOLONE 4 MG PO TBPK: ORAL_TABLET | ORAL | 0 refills | Status: DC

## 2023-07-20 MED ORDER — KETOROLAC TROMETHAMINE 30 MG/ML IJ SOLN
INTRAMUSCULAR | Status: AC
  Filled 2023-07-20: qty 1

## 2023-07-20 MED ORDER — KETOROLAC TROMETHAMINE 30 MG/ML IJ SOLN
30.0000 mg | Freq: Once | INTRAMUSCULAR | Status: AC
  Administered 2023-07-20: 30 mg via INTRAMUSCULAR

## 2023-07-20 MED ORDER — TIZANIDINE HCL 4 MG PO TABS: 4.0000 mg | ORAL_TABLET | Freq: Three times a day (TID) | ORAL | 0 refills | Status: DC | PRN

## 2023-07-20 NOTE — ED Triage Notes (Signed)
 Chief Complaint: Numbness on and off in the arm. Patient states when raising the left arm to shoulder level it will go numb and limp. Staring at the base of the left side of the neck. No known history with the left arm, falls, or injuries. 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 Has some tingling in the left hand as well.   Onset: 2 days  Prescriptions or OTC medications tried: Yes- muscle relaxer    with mild relief of pain.

## 2023-07-20 NOTE — ED Provider Notes (Addendum)
 [facility]    CSN: [id number] Arrival date & time: 07/20/23  1345      History   Chief Complaint Chief Complaint  Patient presents with   Numbness    HPI Caitlin [name] is a 38 y.o. female.   Patient is here for pain and numbness in the left arm off/on for several days.  She was at work, and suddenly had pain and tingling 2 days ago.  EMS was called.  Recommended she go to the UC.  Pain starts at the base of the left neck to the left arm,elbow.  The hand feels numb and tingling.  Has to hand it down in order for the feeling to come back.  She does work at [employer], lifting heavy objects.  She has used 1 muscle relaxer from her mother (robaxin), which did ease it for a bit.           Past Medical History:  Diagnosis Date   Asthma    Seizures [facility])     Patient Active Problem List   Diagnosis Date Noted   UTI (urinary tract infection) 03/30/2022   Generalized anxiety disorder with panic attacks 03/29/2022   MDD (major depressive disorder), single episode, severe , no psychosis (HCC) 03/29/2022   PTSD (post-traumatic stress disorder) 03/29/2022   Insomnia 03/29/2022   Suicidal ideation 03/28/2022   Asthma 04/03/2019    Past Surgical History:  Procedure Laterality Date   CESAREAN SECTION     CHOLECYSTECTOMY     TUBAL LIGATION      OB History   No obstetric history on file.      Home Medications    Prior to Admission medications   Medication Sig Start Date End Date Taking? Authorizing Provider  albuterol (VENTOLIN HFA) 108 (90 Base) MCG/ACT inhaler Inhale 1-2 puffs into the lungs every 6 (six) hours as needed for wheezing or shortness of breath. 04/01/22   Starleen Blue, NP  bacitracin ointment Apply topically 2 (two) times daily. 04/01/22   Starleen Blue, NP  hydrOXYzine (ATARAX) 25 MG tablet Take 1 tablet (25 mg total) by mouth 3 (three) times daily as needed for anxiety. 04/07/22   Bobbye Morton, MD  meloxicam (MOBIC) 7.5 MG tablet  Take 1 tablet (7.5 mg total) by mouth daily. 03/26/23   Renne Crigler, PA-C  nitrofurantoin, macrocrystal-monohydrate, (MACROBID) 100 MG capsule Take 1 capsule (100 mg total) by mouth every 12 (twelve) hours. 04/01/22   Starleen Blue, NP  prazosin (MINIPRESS) 1 MG capsule Take 1 capsule (1 mg total) by mouth at bedtime. 05/04/22   Bobbye Morton, MD  sertraline (ZOLOFT) 100 MG tablet Take 1 tablet (100 mg total) by mouth daily. 04/02/22   Starleen Blue, NP  sertraline (ZOLOFT) 50 MG tablet Take 3 tablets (150 mg total) by mouth daily. 04/07/22 04/07/23  Bobbye Morton, MD  traZODone (DESYREL) 50 MG tablet Take 1 tablet (50 mg total) by mouth at bedtime as needed for sleep. 04/07/22   Bobbye Morton, MD  Vitamin D, Ergocalciferol, (DRISDOL) 1.25 MG (50000 UNIT) CAPS capsule Take 1 capsule (50,000 Units total) by mouth every 7 (seven) days. 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   Review of Systems Review of Systems  Constitutional: Negative.   HENT: Negative.    Respiratory: Negative.    Cardiovascular: Negative.   Gastrointestinal: Negative.   Musculoskeletal:  Positive for neck pain.  Skin: Negative.   Neurological:  Positive for numbness.  Psychiatric/Behavioral: Negative.       Physical Exam Triage Vital Signs ED Triage Vitals  Encounter Vitals Group     BP 07/20/23 1420 127/87     Systolic BP Percentile --      Diastolic BP Percentile --      Pulse Rate 07/20/23 1420 65     Resp 07/20/23 1420 16     Temp 07/20/23 1420 99.1 F (37.3 C)      Temp Source 07/20/23 1420 Oral     SpO2 07/20/23 1420 99 %     Weight --      Height --      Head Circumference --      Peak Flow --      Pain Score 07/20/23 1416 6     Pain Loc --      Pain Education --      Exclude from Growth Chart --    No data found.  Updated Vital Signs BP 127/87 (BP Location: Right Arm)   Pulse 65   Temp 99.1 F (37.3 C) (Oral)   Resp 16   LMP 06/27/2023 (Approximate)   SpO2 99%   Visual Acuity Right Eye Distance:   Left Eye Distance:   Bilateral Distance:    Right Eye Near:   Left Eye Near:    Bilateral Near:     Physical Exam Constitutional:      Appearance: Normal appearance. She is normal weight.  Cardiovascular:     Rate and Rhythm: Normal rate and regular rhythm.  Pulmonary:     Effort: Pulmonary effort is normal.     Breath sounds: Normal breath sounds.  Musculoskeletal:     Comments: Patient holds the left arm to her side throughout the exam.  No spinous tenderness;  She is very TTP to the left trapezius from the neck, to the shoulder;  full rom of the neck without limitation;  slight pain with movement of the neck; she has pain and limited rom to the left shoulder with movement;  she gets shocks/waves of pain down the arm and into the hand;  appears very uncomfortable Decreased hand grip strength to the left hand;  Unable to assess spurlings due to severe pain  Neurological:     General: No focal deficit present.     Mental Status: She is alert.  Psychiatric:        Mood and Affect: Mood normal.      UC Treatments / Results  Labs (all labs ordered are listed, but only abnormal results are displayed) Labs Reviewed - No data to display  EKG   Radiology No results found.  Procedures Procedures (including critical care time)  Medications Ordered in UC Medications  ketorolac (TORADOL) 30 MG/ML injection 30 mg (30 mg Intramuscular Given 07/20/23 1446)    Initial Impression / Assessment and Plan / UC Course  I have  reviewed the triage vital signs and the nursing notes.  Pertinent labs & imaging results that were available during my care of the patient were reviewed by me and considered in my medical decision making (see chart for details).   Final Clinical Impressions(s) / UC Diagnoses   Final diagnoses:  Left arm pain  Left arm numbness     Discharge Instructions      You were seen today for numbness to the left arm.  I think you have a muscle spasm that is irritating a nerve causing pain.  Your xray appears normal, but if the radiologist reads this differently we will notify you.  In the mean time I have given you a shot of toradol for pain.  I have sent out steroid pack to help with pain, as well as a muscle relaxer.   This may make you tired/sleepy so please take when home and not driving.  You may use a heating pad to your neck/back.  If you continue to have symptoms, you should follow up with your primary care provider, who can order further testing.     ED Prescriptions     Medication Sig Dispense Auth. Provider   methylPREDNISolone (MEDROL DOSEPAK) 4 MG TBPK tablet Take as directed 1 each Deakin Lacek, MD   tiZANidine (ZANAFLEX) 4 MG tablet Take 1 tablet (4 mg total) by mouth every 8 (eight) hours as needed. 30 tablet Jannifer Franklin, MD      PDMP not reviewed this encounter.   Jannifer Franklin, MD 07/20/23 1521    Jannifer Franklin, MD 07/20/23 [phone]

## 2023-07-20 NOTE — Discharge Instructions (Addendum)
 You were seen today for numbness to the left arm.  I think you have a muscle spasm that is irritating a nerve causing pain.  Your xray appears normal, but if the radiologist reads this differently we will notify you.  In the mean time I have given you a shot of toradol for pain.  I have sent out steroid pack to help with pain, as well as a muscle relaxer.   This may make you tired/sleepy so please take when home and not driving.  You may use a heating pad to your neck/back.  If you continue to have symptoms, you should follow up with your primary care provider, who can order further testing.

## 2023-08-21 ENCOUNTER — Encounter (HOSPITAL_COMMUNITY): Payer: Self-pay

## 2023-08-21 ENCOUNTER — Ambulatory Visit (HOSPITAL_COMMUNITY)
Admission: EM | Admit: 2023-08-21 | Discharge: 2023-08-21 | Disposition: A | Discharge status: Home / Self Care | Payer: Self-pay

## 2023-08-21 DIAGNOSIS — M25512 Pain in left shoulder: Secondary | ICD-10-CM

## 2023-08-21 DIAGNOSIS — L301 Dyshidrosis [pompholyx]: Secondary | ICD-10-CM

## 2023-08-21 MED ORDER — TRIAMCINOLONE ACETONIDE 0.5 % EX OINT: 1.0000 | TOPICAL_OINTMENT | Freq: Two times a day (BID) | CUTANEOUS | 3 refills | Status: AC

## 2023-08-21 MED ORDER — TIZANIDINE HCL 4 MG PO TABS: 4.0000 mg | ORAL_TABLET | Freq: Three times a day (TID) | ORAL | 0 refills | Status: AC | PRN

## 2023-08-21 NOTE — ED Triage Notes (Signed)
 Patient here today with c/o itchy, tingling, and burning rash on both her hands X 2.5 weeks. Right is worse than the left. Dryness started 2 days ago.

## 2023-08-21 NOTE — Discharge Instructions (Addendum)
 1. Dyshidrotic eczema (Primary) - triamcinolone ointment (KENALOG) 0.5 %; Apply 1 Application topically 2 (two) times daily.  Dispense: 15 g; Refill: 3 - Ambulatory referral to Dermatology for follow-up evaluation and management if symptoms do not improve with triamcinolone ointment  2. Pain in joint of left shoulder - tiZANidine (ZANAFLEX) 4 MG tablet; Take 1 tablet (4 mg total) by mouth every 8 (eight) hours as needed.  Dispense: 30 tablet; Refill: 0 - AMB referral to orthopedics for follow-up evaluation and management of ongoing left shoulder pain.

## 2023-08-21 NOTE — ED Provider Notes (Signed)
 [facility]  Note:  This document was prepared using Dragon voice recognition software and may include unintentional dictation errors.  MRN: [id number] DOB: 02-23-1986  Subjective:   Caitlin [name] is a 38 y.o. female presenting for pruritic rash to bilateral hands and bottom of left foot x 2 and half weeks.  Patient states that rash is worse in the right hand than the left.  Patient has used over-the-counter healing lotion but no steroid cream.  Patient reports history of eczema in the past.  Patient also reports that she is continued to have a left shoulder pain from previous visit here in urgent care.  Patient attempted to follow-up with Ortho but did not have a referral and was advised to follow-up here for Ortho referral.  Patient also requesting refill of Zanaflex that was prescribed for shoulder pain.  No current facility-administered medications for this encounter.  Current Outpatient Medications:    triamcinolone ointment (KENALOG) 0.5 %, Apply 1 Application topically 2 (two) times daily., Disp: 15 g, Rfl: 3   albuterol (VENTOLIN HFA) 108 (90 Base) MCG/ACT inhaler, Inhale 1-2 puffs into the lungs every 6 (six) hours as needed for wheezing or shortness of breath., Disp: 1 each, Rfl: 0   hydrOXYzine (ATARAX) 25 MG tablet, Take 1 tablet (25 mg total) by mouth 3 (three) times daily as needed for anxiety. (Patient not taking: Reported on 08/21/2023), Disp: 90 tablet, Rfl: 2   sertraline (ZOLOFT) 100 MG tablet, Take 1 tablet (100 mg total) by mouth daily. (Patient not taking: Reported on 08/21/2023), Disp: 30 tablet, Rfl: 0   tiZANidine (ZANAFLEX) 4 MG tablet, Take 1 tablet (4 mg total) by mouth every 8 (eight) hours as needed., Disp: 30 tablet, Rfl: 0   traZODone (DESYREL) 50 MG tablet, Take 1 tablet (50 mg total) by mouth at bedtime as needed for sleep. (Patient not taking: Reported on 08/21/2023), Disp: 30 tablet, Rfl: 2   No Known Allergies  Past Medical History:   Diagnosis Date   Asthma    Seizures (HCC)      Past Surgical History:  Procedure Laterality Date   CESAREAN SECTION     CHOLECYSTECTOMY     TUBAL LIGATION      Family History  Problem Relation Age of Onset   Epilepsy Father     Social History   Tobacco Use   Smoking status: Some Days    Current packs/day: 0.25    Types: Cigarettes   Tobacco comments:    2-3 per day   Vaping Use   Vaping status: Some Days  Substance Use Topics   Alcohol use: Not Currently    Comment: occasional   Drug use: Yes    Types: Marijuana    Comment: Daily, 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  Objective:   Vitals: BP 112/74 (BP Location: Left Arm)   Pulse 63   Temp 98.4 F (36.9 C) (Oral)   Resp 16   Ht 4\' 11"  (1.499 m)   LMP 08/21/2023 (Exact Date)   SpO2 99%   BMI 36.36 kg/m   Physical Exam Vitals and nursing note reviewed.  Constitutional:      General: She is not in acute distress.    Appearance: She is well-developed.  HENT:     Head: Normocephalic and atraumatic.  Cardiovascular:     Rate and Rhythm: Normal rate.  Pulmonary:     Effort: Pulmonary effort is normal. No respiratory distress.  Skin:  General: Skin is warm and dry.     Capillary Refill: Capillary refill takes less than 2 seconds.     Findings: Rash (Moderate skin irritation to bilateral hands, on palmar side, worse around the right thumb.) present. No abrasion or erythema. Rash is scaling.  Neurological:     General: No focal deficit present.     Mental Status: She is alert and oriented to person, place, and time.  Psychiatric:        Mood and Affect: Mood normal.     Procedures  No results found for this or any previous visit (from the past 24 hours).  Assessment and Plan :   PDMP not reviewed this encounter.  1. Dyshidrotic eczema   2. Pain in joint of left shoulder    1. Dyshidrotic eczema (Primary) - triamcinolone ointment (KENALOG) 0.5 %; Apply 1 Application  topically 2 (two) times daily.  Dispense: 15 g; Refill: 3 - Ambulatory referral to Dermatology for follow-up evaluation and management if symptoms do not improve with triamcinolone ointment  2. Pain in joint of left shoulder - tiZANidine (ZANAFLEX) 4 MG tablet; Take 1 tablet (4 mg total) by mouth every 8 (eight) hours as needed.  Dispense: 30 tablet; Refill: 0 - AMB referral to orthopedics for follow-up evaluation and management of ongoing left shoulder pain.  Lucky Cowboy   Winfred, [place] B, [place] 08/21/23 1429

## 2024-02-01 ENCOUNTER — Encounter (HOSPITAL_COMMUNITY): Payer: Self-pay

## 2024-02-01 ENCOUNTER — Ambulatory Visit (HOSPITAL_COMMUNITY)
Admission: RE | Admit: 2024-02-01 | Discharge: 2024-02-01 | Disposition: A | Discharge status: Home / Self Care | Payer: Self-pay | Source: Ambulatory Visit | Attending: Emergency Medicine | Admitting: Emergency Medicine

## 2024-02-01 VITALS — BP 126/81 | HR 65 | Temp 98.5°F | Resp 18

## 2024-02-01 DIAGNOSIS — M25562 Pain in left knee: Secondary | ICD-10-CM

## 2024-02-01 MED ORDER — IBUPROFEN 800 MG PO TABS
ORAL_TABLET | ORAL | Status: AC
  Filled 2024-02-01: qty 1

## 2024-02-01 MED ORDER — IBUPROFEN 800 MG PO TABS
800.0000 mg | ORAL_TABLET | Freq: Once | ORAL | Status: AC
  Administered 2024-02-01: 800 mg via ORAL

## 2024-02-01 MED ORDER — IBUPROFEN 800 MG PO TABS: 800.0000 mg | ORAL_TABLET | Freq: Three times a day (TID) | ORAL | 0 refills | Status: AC

## 2024-02-01 NOTE — Discharge Instructions (Addendum)
 Rest - try to avoid heavy lifting and high impact activity Ice - apply for 20 minutes a few times daily Compression - use the knee brace for walking/standing Elevation - prop up on a pillow  Ibuprofen  -- 1 tablet every 6 hours as needed for pain  [facility]

## 2024-02-01 NOTE — ED Triage Notes (Signed)
 Pt c/o lt knee swelling x3 weeks. States stands for work and has to catch the bus. States feels popping in knee and pain to bare weight. Took tylenol  and ibuprofen  with no relief.

## 2024-02-01 NOTE — ED Provider Notes (Signed)
 [facility]    CSN: [id number] Arrival date & time: 02/01/24  1302      History   Chief Complaint Chief Complaint  Patient presents with   APPT - knee swelling    HPI Caitlin [name] is a 38 y.o. female.  Here with 3 weeks of left knee pain, swelling Reports she is on her feet a lot. Stands for work, and walks to/from bus stop Knee pain with weight bearing, and popping sensation, tension  Used ibuprofen  and tylenol   No direct injury or trauma. No prior knee injuries known   Past Medical History:  Diagnosis Date   Asthma    Seizures (HCC)     Patient Active Problem List   Diagnosis Date Noted   UTI (urinary tract infection) 03/30/2022   Generalized anxiety disorder with panic attacks 03/29/2022   MDD (major depressive disorder), single episode, severe , no psychosis (HCC) 03/29/2022   PTSD (post-traumatic stress disorder) 03/29/2022   Insomnia 03/29/2022   Suicidal ideation 03/28/2022   Asthma 04/03/2019    Past Surgical History:  Procedure Laterality Date   CESAREAN SECTION     CHOLECYSTECTOMY     TUBAL LIGATION      OB History   No obstetric history on file.      Home Medications    Prior to Admission medications   Medication Sig Start Date End Date Taking? Authorizing Provider  ibuprofen  (ADVIL ) 800 MG tablet Take 1 tablet (800 mg total) by mouth 3 (three) times daily. 02/01/24  Yes Donnie Panik, [place], PA-C  albuterol  (VENTOLIN  HFA) 108 (90 Base) MCG/ACT inhaler Inhale 1-2 puffs into the lungs every 6 (six) hours as needed for wheezing or shortness of breath. 04/01/22   Tex Drilling, NP  tiZANidine  (ZANAFLEX ) 4 MG tablet Take 1 tablet (4 mg total) by mouth every 8 (eight) hours as needed. 08/21/23   Reddick, Johnathan B, NP  triamcinolone  ointment (KENALOG ) 0.5 % Apply 1 Application topically 2 (two) times daily. 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   Review of Systems Review of Systems  As per HPI  Physical Exam Triage Vital Signs ED Triage Vitals  Encounter Vitals Group     BP 02/01/24 1325 126/81     Girls Systolic BP Percentile --      Girls Diastolic BP Percentile --      Boys Systolic BP Percentile --      Boys Diastolic BP Percentile --      Pulse Rate 02/01/24 1325 65     Resp 02/01/24 1325 18     Temp 02/01/24 1325 98.5 F (36.9 C)     Temp Source 02/01/24 1325 Oral     SpO2 02/01/24 1325 98 %     Weight --      Height --      Head Circumference --  Peak Flow --      Pain Score 02/01/24 1327 4     Pain Loc --      Pain Education --      Exclude from Growth Chart --    No data found.  Updated Vital Signs BP 126/81 (BP Location: Right Arm)   Pulse 65   Temp 98.5 F (36.9 C) (Oral)   Resp 18   LMP 01/24/2024 (Exact Date)   SpO2 98%    Physical Exam Vitals and nursing note reviewed.  Constitutional:      General: She is not in acute distress. HENT:     Mouth/Throat:     Pharynx: Oropharynx is clear.  Cardiovascular:     Rate and Rhythm: Normal rate and regular rhythm.     Pulses: Normal pulses.  Pulmonary:     Effort: Pulmonary effort is normal.  Musculoskeletal:     Cervical back: Normal range of motion.     Left knee: Swelling present. No deformity, erythema or crepitus. Normal range of motion. Tenderness present. Normal pulse.     Comments: [place] limits exam. The left anterior knee does appear swollen compared with right. Tenderness to palpation. Reports pain with ROM although intact.  Distal sensation intact. Strength 5/5. DP pulse 2+  Skin:    Capillary Refill: Capillary refill takes less than 2 seconds.  Neurological:     Mental Status: She is alert and oriented to person, place, and time.     UC Treatments / Results  Labs (all labs ordered are listed, but only abnormal results are displayed) Labs Reviewed - No data to display  EKG  Radiology No results found.  Procedures Procedures   Medications Ordered in UC Medications  ibuprofen  (ADVIL ) tablet 800 mg (has no administration in time range)    Initial Impression / Assessment and Plan / UC Course  I have reviewed the triage vital signs and the nursing notes.  Pertinent labs & imaging results that were available during my care of the patient were reviewed by me and considered in my medical decision making (see chart for details).  Discussed limitations of this urgent care. Offered xray although I do feel her symptoms are more likely soft tissue; muscle, meniscus, or ligament. No direct injury/trauma to indicate need for pain films. Patient declines xray at this time.  Also declines IM toradol . Oral ibuprofen  dose given instead, and prescribed. Continue q6 hours prn Elevation and ice is advised Brace is applied for support [facility] urgent care hours are provided, discussed she can go there today.  Work note given.  Agrees to plan, all questions answered  Final Clinical Impressions(s) / UC Diagnoses   Final diagnoses:  Acute pain of left knee     Discharge Instructions      Rest - try to avoid heavy lifting and high impact activity Ice - apply for 20 minutes a few times daily Compression - use the knee brace for walking/standing Elevation - prop up on a pillow  Ibuprofen  -- 1 tablet every 6 hours as needed for pain  Go to [facility] for further management       ED Prescriptions     Medication Sig Dispense Auth. Provider   ibuprofen  (ADVIL ) 800 MG tablet Take 1 tablet (800 mg  total) by mouth 3 (three) times daily. 21 tablet Agustine Rossitto, [place], PA-C      PDMP not reviewed this encounter.   Jatoria Kneeland, [place], [place] 02/01/24 1402

## 2024-05-29 ENCOUNTER — Other Ambulatory Visit: Payer: Self-pay

## 2024-05-29 ENCOUNTER — Emergency Department (HOSPITAL_COMMUNITY): Admission: EM | Admit: 2024-05-29 | Discharge: 2024-05-30 | Discharge status: Against Medical Advice

## 2024-05-29 ENCOUNTER — Encounter (HOSPITAL_COMMUNITY): Payer: Self-pay

## 2024-05-29 DIAGNOSIS — Z5321 Procedure and treatment not carried out due to patient leaving prior to being seen by health care provider: Secondary | ICD-10-CM | POA: Insufficient documentation

## 2024-05-29 DIAGNOSIS — M79651 Pain in right thigh: Secondary | ICD-10-CM | POA: Insufficient documentation

## 2024-05-29 LAB — CBC WITH DIFFERENTIAL/PLATELET
Abs Immature Granulocytes: 0.01 K/uL (ref 0.00–0.07)
Basophils Absolute: 0 K/uL (ref 0.0–0.1)
Basophils Relative: 1 %
Eosinophils Absolute: 0.4 K/uL (ref 0.0–0.5)
Eosinophils Relative: 6 %
HCT: 39.1 % (ref 36.0–46.0)
Hemoglobin: 12.4 g/dL (ref 12.0–15.0)
Immature Granulocytes: 0 %
Lymphocytes Relative: 33 %
Lymphs Abs: 2.1 K/uL (ref 0.7–4.0)
MCH: 26.8 pg (ref 26.0–34.0)
MCHC: 31.7 g/dL (ref 30.0–36.0)
MCV: 84.6 fL (ref 80.0–100.0)
Monocytes Absolute: 0.4 K/uL (ref 0.1–1.0)
Monocytes Relative: 6 %
Neutro Abs: 3.5 K/uL (ref 1.7–7.7)
Neutrophils Relative %: 54 %
Platelets: 450 K/uL — ABNORMAL HIGH (ref 150–400)
RBC: 4.62 MIL/uL (ref 3.87–5.11)
RDW: 13.5 % (ref 11.5–15.5)
WBC: 6.4 K/uL (ref 4.0–10.5)
nRBC: 0 % (ref 0.0–0.2)

## 2024-05-29 LAB — BASIC METABOLIC PANEL WITH GFR
Anion gap: 13 (ref 5–15)
BUN: 11 mg/dL (ref 6–20)
CO2: 21 mmol/L — ABNORMAL LOW (ref 22–32)
Calcium: 9.6 mg/dL (ref 8.9–10.3)
Chloride: 105 mmol/L (ref 98–111)
Creatinine, Ser: 0.72 mg/dL (ref 0.44–1.00)
GFR, Estimated: >60 mL/min
Glucose, Bld: 89 mg/dL (ref 70–99)
Potassium: 4 mmol/L (ref 3.5–5.1)
Sodium: 139 mmol/L (ref 135–145)

## 2024-05-29 MED ORDER — OXYCODONE-ACETAMINOPHEN 5-325 MG PO TABS
1.0000 | ORAL_TABLET | Freq: Once | ORAL | Status: AC
  Administered 2024-05-29: 1 via ORAL
  Filled 2024-05-29: qty 1

## 2024-05-29 MED ORDER — MORPHINE SULFATE (PF) 2 MG/ML IV SOLN: 4.0000 mg | Freq: Once | INTRAVENOUS | Status: DC

## 2024-05-29 NOTE — ED Triage Notes (Signed)
 Pt here for an abscess to right inner thigh, pt states pus/blood coming out. Axox4.

## 2024-05-29 NOTE — ED Notes (Signed)
 Pt called x2 for vitas with no answer

## 2024-05-29 NOTE — ED Provider Triage Note (Signed)
 Emergency Medicine Provider Triage Evaluation Note  Lutisha LEITH HEDLUND , a 39 y.o. female  was evaluated in triage.  Pt complains of right inner thigh pain, secondary to an abscess.  Patient reports that she first noticed the abscess the day after Christmas, and it has gotten worse.  She states she is in excruciating pain.  Patient reports she has tried taking Tylenol  and 800 mg of ibuprofen  without relief.  She states she has had a history of prior abscesses, but has not had any since she stopped wearing jeans.  Review of Systems  Positive: Pain Negative: Fever, chills, body aches  Physical Exam  BP (!) 130/97 (BP Location: Right Arm)   Pulse 67   Temp 98.2 F (36.8 C) (Oral)   Resp (!) 22   LMP 05/14/2024 (Approximate)   SpO2 100%  Gen:   Awake, mild distress, tearful Resp:  Normal effort  MSK:   Moves extremities without difficulty  Other:  Abscesses currently bandaged, but there does appear to be some drainage with both blood and pus from the area.  Medical Decision Making  Medically screening exam initiated at 1:30 PM.  Appropriate orders placed.  [place] A Rix was informed that the remainder of the evaluation will be completed by another provider, this initial triage assessment does not replace that evaluation, and the importance of remaining in the ED until their evaluation is complete.  Labs and pain meds ordered.   Torrence Marry RAMAN, PA-C 05/29/24 1332

## 2024-05-30 ENCOUNTER — Emergency Department (HOSPITAL_BASED_OUTPATIENT_CLINIC_OR_DEPARTMENT_OTHER)
Admission: EM | Admit: 2024-05-30 | Discharge: 2024-05-30 | Disposition: A | Discharge status: Home / Self Care | Attending: Emergency Medicine | Admitting: Emergency Medicine

## 2024-05-30 ENCOUNTER — Encounter (HOSPITAL_BASED_OUTPATIENT_CLINIC_OR_DEPARTMENT_OTHER): Payer: Self-pay | Admitting: Emergency Medicine

## 2024-05-30 DIAGNOSIS — M65051 Abscess of tendon sheath, right thigh: Secondary | ICD-10-CM | POA: Insufficient documentation

## 2024-05-30 DIAGNOSIS — L0291 Cutaneous abscess, unspecified: Secondary | ICD-10-CM

## 2024-05-30 DIAGNOSIS — Z79899 Other long term (current) drug therapy: Secondary | ICD-10-CM | POA: Insufficient documentation

## 2024-05-30 LAB — PREGNANCY, URINE: Preg Test, Ur: NEGATIVE

## 2024-05-30 MED ORDER — OXYCODONE-ACETAMINOPHEN 5-325 MG PO TABS: 1.0000 | ORAL_TABLET | Freq: Four times a day (QID) | ORAL | 0 refills | Status: AC | PRN

## 2024-05-30 MED ORDER — DOXYCYCLINE HYCLATE 100 MG PO CAPS: 100.0000 mg | ORAL_CAPSULE | Freq: Two times a day (BID) | ORAL | 0 refills | Status: AC

## 2024-05-30 MED ORDER — LORAZEPAM 1 MG PO TABS
1.0000 mg | ORAL_TABLET | Freq: Once | ORAL | Status: AC
  Administered 2024-05-30: 1 mg via ORAL
  Filled 2024-05-30: qty 1

## 2024-05-30 MED ORDER — LIDOCAINE-EPINEPHRINE-TETRACAINE (LET) TOPICAL GEL
3.0000 mL | Freq: Once | TOPICAL | Status: AC
  Administered 2024-05-30: 3 mL via TOPICAL
  Filled 2024-05-30: qty 3

## 2024-05-30 MED ORDER — LIDOCAINE-EPINEPHRINE-TETRACAINE (LET) TOPICAL GEL
3.0000 mL | TOPICAL | Status: DC
  Administered 2024-05-30: 3 mL

## 2024-05-30 NOTE — ED Provider Notes (Signed)
 " [place] [facility] Provider Note   CSN: [id number] Arrival date & time: 05/30/24  1259     Patient presents with: Abscess   Caitlin [name] is a 39 y.o. female.   39 year old female presenting with right inner thigh abscess.  Patient notes that symptoms been ongoing since right after Christmas, reports that abscess has been draining on its own for several days now, but the discomfort at the site persist.  Patient felt hot at home around the time that she developed the abscess, but no known fevers to report.  No history of diabetes.   Abscess      Prior to Admission medications  Medication Sig Start Date End Date Taking? Authorizing Provider  albuterol  (VENTOLIN  HFA) 108 (90 Base) MCG/ACT inhaler Inhale 1-2 puffs into the lungs every 6 (six) hours as needed for wheezing or shortness of breath. 04/01/22   Tex Drilling, NP  ibuprofen  (ADVIL ) 800 MG tablet Take 1 tablet (800 mg total) by mouth 3 (three) times daily. 02/01/24   Rising, [place], PA-C  tiZANidine  (ZANAFLEX ) 4 MG tablet Take 1 tablet (4 mg total) by mouth every 8 (eight) hours as needed. 08/21/23   Reddick, Johnathan B, NP  triamcinolone  ointment (KENALOG ) 0.5 % Apply 1 Application topically 2 (two) times daily. 08/21/23   Reddick, Johnathan B, NP    Allergies: Patient has no known allergies.    Review of Systems  Updated Vital Signs  Vitals:   05/30/24 1429 05/30/24 1946  BP: 128/78 105/89  Pulse: 96 87  Resp: 20 20  Temp: 98.4 F (36.9 C) 98.7 F (37.1 C)  TempSrc: Oral Oral  SpO2: 100% 100%     Physical Exam Vitals and nursing note reviewed.  HENT:     Head: Normocephalic.  Eyes:     Extraocular Movements: Extraocular movements intact.  Cardiovascular:     Rate and Rhythm: Normal rate.  Pulmonary:     Effort: Pulmonary effort is normal.  Musculoskeletal:     Cervical back: Normal range of motion.     Comments: Moves all extremities spontaneously without  difficulty  Skin:    General: Skin is warm and dry.     Comments: 2-3cm Abscess to R inner thigh actively draining a small amount of bloody/purulent fluid, no obvious spreading/streaking erythema or warmth, tender on exam  Neurological:     Mental Status: She is alert and oriented to person, place, and time.     (all labs ordered are listed, but only abnormal results are displayed) Labs Reviewed - No data to display  EKG: None  Radiology: No results found.   .Incision and Drainage  Date/Time: 05/30/2024 8:47 PM  Performed by: Glendia Rocky SAILOR, PA-C Authorized by: Glendia Rocky SAILOR, PA-C   Consent:    Consent obtained:  Verbal   Consent given by:  Patient   Risks, benefits, and alternatives were discussed: yes     Risks discussed:  Bleeding, incomplete drainage, pain, infection and damage to other organs   Alternatives discussed:  No treatment Universal protocol:    Procedure explained and questions answered to patient or proxy's satisfaction: yes     Patient identity confirmed:  Verbally with patient Location:    Type:  Abscess   Location: R inner thigh. Pre-procedure details:    Skin preparation:  Povidone-iodine Anesthesia:    Anesthesia method:  Topical application   Topical anesthetic:  3 mL lidocaine -EPINEPHrine-tetracaine Procedure type:    Complexity:  Simple Procedure  details:    Incision types:  Single straight   Wound management:  Irrigated with saline   Drainage:  Bloody and purulent   Drainage amount:  Moderate   Wound treatment:  Wound left open   Packing materials:  None Post-procedure details:    Procedure completion:  Tolerated with difficulty    Medications Ordered in the ED  lidocaine -EPINEPHrine-tetracaine (LET) topical gel (3 mLs Topical Given 05/30/24 1927)  LORazepam  (ATIVAN ) tablet 1 mg (1 mg Oral Given 05/30/24 2050)                                    Medical Decision Making This patient presents to the ED for concern of abscess, this  involves an extensive number of treatment options, and is a complaint that carries with it a high risk of complications and morbidity.  The differential diagnosis includes abscess, cellulitis, erysipelas, necrotizing fasciitis   Co morbidities that complicate the patient evaluation  MDD, GAD    Additional history obtained:  Additional history obtained from record review External records from outside source obtained and reviewed including prior ED note   Cardiac Monitoring: / EKG:  The patient was maintained on a cardiac monitor.  I personally viewed and interpreted the cardiac monitored which showed an underlying rhythm of: NSR   Problem List / ED Course / Critical interventions / Medication management  I ordered medication including Ativan  for anxiety Reevaluation of the patient after these medicines showed that the patient improved I have reviewed the patients home medicines and have made adjustments as needed   Social Determinants of Health:  Tobacco use, unmet transportation needs   Test / Admission - Considered:  Physical exam is notable as above, abscess formation noted to right inner thigh that is actively draining a minimal amount of bloody/purulent fluid.  No signs of systemic illness, patient is otherwise well-appearing and in no acute distress, she is without fever or tachycardia.  No signs of surrounding cellulitic changes.  Patient requests to avoid infiltration of lidocaine  at the site of the abscess due to fear of pain from needle, will apply LET for anesthesia instead.  Incision and drainage performed as above with incision made directly over already open portion of abscess, irrigated thoroughly with normal saline flushes, wound left open to allow for continued drainage, nonadhesive dressing applied by nursing staff prior to discharge.  Will start patient on course of doxycycline  and provide short course of Percocet to be used as needed for breakthrough pain.  Strict  return precautions discussed, patient voiced understanding and is in agreement with this plan, she is appropriate for discharge at this time.    Amount and/or Complexity of Data Reviewed Labs: ordered.  Risk Prescription drug management.        Final diagnoses:  Abscess    ED Discharge Orders          Ordered    doxycycline  (VIBRAMYCIN ) 100 MG capsule  2 times daily        05/30/24 2127    oxyCODONE -acetaminophen  (PERCOCET/ROXICET) 5-325 MG tablet  Every 6 hours PRN        05/30/24 2127               Adelynne Joerger N, PA-C 05/30/24 2134    Patsey Lot, MD 05/30/24 2351  "

## 2024-05-30 NOTE — Discharge Instructions (Signed)
 Start doxycycline , take 1 tablet by mouth twice daily for 5 days.  Continue Percocet, 1 tablet by mouth every 6 hours as needed for breakthrough pain, please be aware this medication may cause fatigue/drowsiness.  Return to the emergency department if you experience worsening pain/swelling at the site of your abscess, or if you develop a fever.  I have provided you with the contact information for [facility], please contact their office to schedule follow-up to establish care since you do not have a primary care provider.

## 2024-05-30 NOTE — ED Notes (Signed)

## 2024-05-30 NOTE — ED Notes (Signed)
 Dressing place on leg for drainage

## 2024-05-30 NOTE — ED Notes (Signed)
 Nonadherent strips applied to wound and nonadherent gaze applied over dressing. Adherent tape used to hold down dressing.

## 2024-05-30 NOTE — ED Triage Notes (Signed)
 Abscess to right innner thigh With drainage Noticed day after christmas
